# Patient Record
Sex: Female | Born: 1960 | Race: White | Hispanic: No | Marital: Married | Smoking: Never smoker
Health system: Southern US, Community
[De-identification: ages and names within clinical notes are randomized; demographics above are authoritative.]

## PROBLEM LIST (undated history)

## (undated) DIAGNOSIS — B1920 Unspecified viral hepatitis C without hepatic coma: Secondary | ICD-10-CM

## (undated) DIAGNOSIS — G43909 Migraine, unspecified, not intractable, without status migrainosus: Secondary | ICD-10-CM

## (undated) DIAGNOSIS — E785 Hyperlipidemia, unspecified: Secondary | ICD-10-CM

## (undated) DIAGNOSIS — F419 Anxiety disorder, unspecified: Secondary | ICD-10-CM

## (undated) DIAGNOSIS — K219 Gastro-esophageal reflux disease without esophagitis: Secondary | ICD-10-CM

## (undated) DIAGNOSIS — K838 Other specified diseases of biliary tract: Secondary | ICD-10-CM

## (undated) DIAGNOSIS — F32A Depression, unspecified: Secondary | ICD-10-CM

## (undated) DIAGNOSIS — R51 Headache: Secondary | ICD-10-CM

## (undated) DIAGNOSIS — G4482 Headache associated with sexual activity: Secondary | ICD-10-CM

## (undated) DIAGNOSIS — E079 Disorder of thyroid, unspecified: Secondary | ICD-10-CM

## (undated) DIAGNOSIS — K529 Noninfective gastroenteritis and colitis, unspecified: Secondary | ICD-10-CM

## (undated) DIAGNOSIS — E876 Hypokalemia: Secondary | ICD-10-CM

## (undated) DIAGNOSIS — F909 Attention-deficit hyperactivity disorder, unspecified type: Secondary | ICD-10-CM

## (undated) DIAGNOSIS — E039 Hypothyroidism, unspecified: Secondary | ICD-10-CM

## (undated) DIAGNOSIS — M1990 Unspecified osteoarthritis, unspecified site: Secondary | ICD-10-CM

## (undated) DIAGNOSIS — R519 Headache, unspecified: Secondary | ICD-10-CM

## (undated) DIAGNOSIS — F329 Major depressive disorder, single episode, unspecified: Secondary | ICD-10-CM

## (undated) HISTORY — PX: REDUCTION MAMMAPLASTY: SUR839

## (undated) HISTORY — PX: JOINT REPLACEMENT: SHX530

## (undated) HISTORY — PX: ABDOMINAL HYSTERECTOMY: SHX81

## (undated) HISTORY — PX: ROTATOR CUFF REPAIR: SHX139

## (undated) HISTORY — DX: Noninfective gastroenteritis and colitis, unspecified: K52.9

## (undated) HISTORY — DX: Other specified diseases of biliary tract: K83.8

## (undated) HISTORY — DX: Headache associated with sexual activity: G44.82

## (undated) HISTORY — DX: Migraine, unspecified, not intractable, without status migrainosus: G43.909

## (undated) HISTORY — DX: Hypokalemia: E87.6

---

## 2013-04-14 ENCOUNTER — Ambulatory Visit: Payer: Commercial Managed Care - PPO | Attending: Family Medicine | Admitting: Physical Therapy

## 2013-05-29 ENCOUNTER — Emergency Department (HOSPITAL_COMMUNITY): Payer: 59

## 2013-05-29 ENCOUNTER — Encounter (HOSPITAL_COMMUNITY): Payer: Self-pay | Admitting: *Deleted

## 2013-05-29 ENCOUNTER — Inpatient Hospital Stay (HOSPITAL_COMMUNITY)
Admission: EM | Admit: 2013-05-29 | Discharge: 2013-05-31 | DRG: 392 | Disposition: A | Discharge status: Home / Self Care | Payer: 59 | Attending: Internal Medicine | Admitting: Internal Medicine

## 2013-05-29 DIAGNOSIS — Z96659 Presence of unspecified artificial knee joint: Secondary | ICD-10-CM

## 2013-05-29 DIAGNOSIS — E876 Hypokalemia: Secondary | ICD-10-CM | POA: Diagnosis present

## 2013-05-29 DIAGNOSIS — F909 Attention-deficit hyperactivity disorder, unspecified type: Secondary | ICD-10-CM | POA: Diagnosis present

## 2013-05-29 DIAGNOSIS — F32A Depression, unspecified: Secondary | ICD-10-CM | POA: Diagnosis present

## 2013-05-29 DIAGNOSIS — G43909 Migraine, unspecified, not intractable, without status migrainosus: Secondary | ICD-10-CM

## 2013-05-29 DIAGNOSIS — R197 Diarrhea, unspecified: Secondary | ICD-10-CM

## 2013-05-29 DIAGNOSIS — K838 Other specified diseases of biliary tract: Secondary | ICD-10-CM

## 2013-05-29 DIAGNOSIS — R509 Fever, unspecified: Secondary | ICD-10-CM

## 2013-05-29 DIAGNOSIS — R109 Unspecified abdominal pain: Secondary | ICD-10-CM

## 2013-05-29 DIAGNOSIS — F329 Major depressive disorder, single episode, unspecified: Secondary | ICD-10-CM | POA: Diagnosis present

## 2013-05-29 DIAGNOSIS — A088 Other specified intestinal infections: Secondary | ICD-10-CM | POA: Diagnosis present

## 2013-05-29 DIAGNOSIS — K529 Noninfective gastroenteritis and colitis, unspecified: Secondary | ICD-10-CM

## 2013-05-29 DIAGNOSIS — R112 Nausea with vomiting, unspecified: Secondary | ICD-10-CM

## 2013-05-29 DIAGNOSIS — A0811 Acute gastroenteropathy due to Norwalk agent: Principal | ICD-10-CM | POA: Diagnosis present

## 2013-05-29 DIAGNOSIS — F341 Dysthymic disorder: Secondary | ICD-10-CM | POA: Diagnosis present

## 2013-05-29 DIAGNOSIS — E785 Hyperlipidemia, unspecified: Secondary | ICD-10-CM | POA: Diagnosis present

## 2013-05-29 DIAGNOSIS — E739 Lactose intolerance, unspecified: Secondary | ICD-10-CM | POA: Diagnosis present

## 2013-05-29 DIAGNOSIS — K219 Gastro-esophageal reflux disease without esophagitis: Secondary | ICD-10-CM | POA: Diagnosis present

## 2013-05-29 DIAGNOSIS — K828 Other specified diseases of gallbladder: Secondary | ICD-10-CM | POA: Diagnosis present

## 2013-05-29 DIAGNOSIS — E079 Disorder of thyroid, unspecified: Secondary | ICD-10-CM | POA: Diagnosis present

## 2013-05-29 DIAGNOSIS — K5289 Other specified noninfective gastroenteritis and colitis: Secondary | ICD-10-CM

## 2013-05-29 HISTORY — DX: Disorder of thyroid, unspecified: E07.9

## 2013-05-29 HISTORY — DX: Major depressive disorder, single episode, unspecified: F32.9

## 2013-05-29 HISTORY — DX: Depression, unspecified: F32.A

## 2013-05-29 HISTORY — DX: Other specified diseases of biliary tract: K83.8

## 2013-05-29 HISTORY — DX: Hypokalemia: E87.6

## 2013-05-29 HISTORY — DX: Hyperlipidemia, unspecified: E78.5

## 2013-05-29 HISTORY — DX: Gastro-esophageal reflux disease without esophagitis: K21.9

## 2013-05-29 HISTORY — DX: Migraine, unspecified, not intractable, without status migrainosus: G43.909

## 2013-05-29 HISTORY — DX: Noninfective gastroenteritis and colitis, unspecified: K52.9

## 2013-05-29 LAB — CBC WITH DIFFERENTIAL/PLATELET
Basophils Relative: 1 % (ref 0–1)
Hemoglobin: 13.5 g/dL (ref 12.0–15.0)
Lymphocytes Relative: 14 % (ref 12–46)
MCHC: 34.9 g/dL (ref 30.0–36.0)
Monocytes Relative: 11 % (ref 3–12)
Neutro Abs: 3.2 10*3/uL (ref 1.7–7.7)
Neutrophils Relative %: 75 % (ref 43–77)
RBC: 4.37 MIL/uL (ref 3.87–5.11)
WBC: 4.2 10*3/uL (ref 4.0–10.5)

## 2013-05-29 LAB — CBC
HCT: 39.2 % (ref 36.0–46.0)
MCH: 30.8 pg (ref 26.0–34.0)
MCHC: 34.4 g/dL (ref 30.0–36.0)
MCV: 89.5 fL (ref 78.0–100.0)
RDW: 13 % (ref 11.5–15.5)
WBC: 3.7 10*3/uL — ABNORMAL LOW (ref 4.0–10.5)

## 2013-05-29 LAB — OCCULT BLOOD, POC DEVICE: Fecal Occult Bld: NEGATIVE

## 2013-05-29 LAB — COMPREHENSIVE METABOLIC PANEL
AST: 19 U/L (ref 0–37)
Albumin: 3 g/dL — ABNORMAL LOW (ref 3.5–5.2)
Alkaline Phosphatase: 91 U/L (ref 39–117)
BUN: 5 mg/dL — ABNORMAL LOW (ref 6–23)
Chloride: 94 mEq/L — ABNORMAL LOW (ref 96–112)
Potassium: 3.1 mEq/L — ABNORMAL LOW (ref 3.5–5.1)
Total Bilirubin: 0.3 mg/dL (ref 0.3–1.2)

## 2013-05-29 LAB — CREATININE, SERUM: Creatinine, Ser: 0.7 mg/dL (ref 0.50–1.10)

## 2013-05-29 LAB — URINALYSIS, ROUTINE W REFLEX MICROSCOPIC
Bilirubin Urine: NEGATIVE
Ketones, ur: NEGATIVE mg/dL
Protein, ur: NEGATIVE mg/dL
Urobilinogen, UA: 0.2 mg/dL (ref 0.0–1.0)

## 2013-05-29 LAB — LACTIC ACID, PLASMA: Lactic Acid, Venous: 0.8 mmol/L (ref 0.5–2.2)

## 2013-05-29 LAB — LIPASE, BLOOD: Lipase: 22 U/L (ref 11–59)

## 2013-05-29 LAB — URINE MICROSCOPIC-ADD ON

## 2013-05-29 MED ORDER — CIPROFLOXACIN IN D5W 400 MG/200ML IV SOLN
400.0000 mg | Freq: Two times a day (BID) | INTRAVENOUS | Status: DC
  Administered 2013-05-29 – 2013-05-31 (×4): 400 mg via INTRAVENOUS
  Filled 2013-05-29 (×5): qty 200

## 2013-05-29 MED ORDER — IOHEXOL 300 MG/ML  SOLN: 50.0000 mL | Freq: Once | INTRAMUSCULAR | Status: DC | PRN

## 2013-05-29 MED ORDER — METRONIDAZOLE IN NACL 5-0.79 MG/ML-% IV SOLN
500.0000 mg | Freq: Three times a day (TID) | INTRAVENOUS | Status: DC
  Administered 2013-05-29 – 2013-05-31 (×6): 500 mg via INTRAVENOUS
  Filled 2013-05-29 (×7): qty 100

## 2013-05-29 MED ORDER — SODIUM CHLORIDE 0.9 % IV SOLN
INTRAVENOUS | Status: DC
  Administered 2013-05-29 – 2013-05-31 (×5): via INTRAVENOUS
  Filled 2013-05-29 (×9): qty 1000

## 2013-05-29 MED ORDER — POTASSIUM CHLORIDE CRYS ER 20 MEQ PO TBCR
40.0000 meq | EXTENDED_RELEASE_TABLET | Freq: Once | ORAL | Status: AC
  Administered 2013-05-29: 40 meq via ORAL
  Filled 2013-05-29 (×2): qty 1

## 2013-05-29 MED ORDER — ALUM & MAG HYDROXIDE-SIMETH 200-200-20 MG/5ML PO SUSP: 30.0000 mL | Freq: Four times a day (QID) | ORAL | Status: DC | PRN

## 2013-05-29 MED ORDER — ONDANSETRON HCL 4 MG PO TABS: 4.0000 mg | ORAL_TABLET | Freq: Four times a day (QID) | ORAL | Status: DC | PRN

## 2013-05-29 MED ORDER — AMPHETAMINE-DEXTROAMPHET ER 25 MG PO CP24: 25.0000 mg | ORAL_CAPSULE | ORAL | Status: DC

## 2013-05-29 MED ORDER — SODIUM CHLORIDE 0.9 % IV BOLUS (SEPSIS)
500.0000 mL | Freq: Once | INTRAVENOUS | Status: AC
  Administered 2013-05-29: 500 mL via INTRAVENOUS

## 2013-05-29 MED ORDER — PANTOPRAZOLE SODIUM 40 MG PO TBEC
40.0000 mg | DELAYED_RELEASE_TABLET | Freq: Every day | ORAL | Status: DC
  Administered 2013-05-29 – 2013-05-31 (×3): 40 mg via ORAL
  Filled 2013-05-29 (×3): qty 1

## 2013-05-29 MED ORDER — ONDANSETRON HCL 4 MG/2ML IJ SOLN
4.0000 mg | INTRAMUSCULAR | Status: AC | PRN
  Administered 2013-05-29 (×2): 4 mg via INTRAVENOUS
  Filled 2013-05-29 (×2): qty 2

## 2013-05-29 MED ORDER — MECLIZINE HCL 25 MG PO TABS
25.0000 mg | ORAL_TABLET | Freq: Three times a day (TID) | ORAL | Status: DC | PRN
  Administered 2013-05-29: 25 mg via ORAL
  Filled 2013-05-29: qty 1

## 2013-05-29 MED ORDER — DICYCLOMINE HCL 10 MG/ML IM SOLN
20.0000 mg | Freq: Once | INTRAMUSCULAR | Status: AC
  Administered 2013-05-29: 20 mg via INTRAMUSCULAR
  Filled 2013-05-29: qty 2

## 2013-05-29 MED ORDER — ALBUTEROL SULFATE (5 MG/ML) 0.5% IN NEBU: 2.5000 mg | INHALATION_SOLUTION | RESPIRATORY_TRACT | Status: DC | PRN

## 2013-05-29 MED ORDER — SODIUM CHLORIDE 0.9 % IV SOLN
INTRAVENOUS | Status: DC
  Administered 2013-05-29 (×2): via INTRAVENOUS

## 2013-05-29 MED ORDER — ONDANSETRON HCL 4 MG/2ML IJ SOLN
4.0000 mg | Freq: Three times a day (TID) | INTRAMUSCULAR | Status: AC | PRN
  Filled 2013-05-29: qty 2

## 2013-05-29 MED ORDER — ENOXAPARIN SODIUM 40 MG/0.4ML ~~LOC~~ SOLN
40.0000 mg | SUBCUTANEOUS | Status: DC
  Administered 2013-05-29 – 2013-05-30 (×2): 40 mg via SUBCUTANEOUS
  Filled 2013-05-29 (×3): qty 0.4

## 2013-05-29 MED ORDER — LEVOTHYROXINE SODIUM 100 MCG PO TABS
100.0000 ug | ORAL_TABLET | Freq: Every day | ORAL | Status: DC
  Administered 2013-05-30 – 2013-05-31 (×2): 100 ug via ORAL
  Filled 2013-05-29 (×3): qty 1

## 2013-05-29 MED ORDER — ACETAMINOPHEN 500 MG PO TABS
1000.0000 mg | ORAL_TABLET | Freq: Once | ORAL | Status: AC
  Administered 2013-05-29: 1000 mg via ORAL
  Filled 2013-05-29: qty 2

## 2013-05-29 MED ORDER — PAROXETINE HCL 30 MG PO TABS
60.0000 mg | ORAL_TABLET | ORAL | Status: DC
  Administered 2013-05-30 – 2013-05-31 (×2): 60 mg via ORAL
  Filled 2013-05-29 (×3): qty 2

## 2013-05-29 MED ORDER — ZOLPIDEM TARTRATE 5 MG PO TABS: 5.0000 mg | ORAL_TABLET | Freq: Every evening | ORAL | Status: DC | PRN

## 2013-05-29 MED ORDER — ACETAMINOPHEN 325 MG PO TABS
650.0000 mg | ORAL_TABLET | Freq: Four times a day (QID) | ORAL | Status: DC | PRN
  Administered 2013-05-30: 650 mg via ORAL
  Filled 2013-05-29: qty 2

## 2013-05-29 MED ORDER — SUMATRIPTAN SUCCINATE 100 MG PO TABS
100.0000 mg | ORAL_TABLET | ORAL | Status: DC | PRN
  Filled 2013-05-29: qty 1

## 2013-05-29 MED ORDER — MORPHINE SULFATE 4 MG/ML IJ SOLN
4.0000 mg | INTRAMUSCULAR | Status: DC | PRN
  Administered 2013-05-29: 4 mg via INTRAVENOUS
  Filled 2013-05-29: qty 1

## 2013-05-29 MED ORDER — ACETAMINOPHEN 650 MG RE SUPP: 650.0000 mg | Freq: Four times a day (QID) | RECTAL | Status: DC | PRN

## 2013-05-29 MED ORDER — CLONAZEPAM 1 MG PO TABS
1.0000 mg | ORAL_TABLET | Freq: Every day | ORAL | Status: DC | PRN
  Administered 2013-05-29 – 2013-05-30 (×2): 1 mg via ORAL
  Filled 2013-05-29 (×2): qty 1

## 2013-05-29 MED ORDER — SACCHAROMYCES BOULARDII 250 MG PO CAPS
250.0000 mg | ORAL_CAPSULE | Freq: Two times a day (BID) | ORAL | Status: DC
  Administered 2013-05-29 – 2013-05-31 (×5): 250 mg via ORAL
  Filled 2013-05-29 (×6): qty 1

## 2013-05-29 MED ORDER — SODIUM CHLORIDE 0.9 % IV SOLN: INTRAVENOUS | Status: DC

## 2013-05-29 MED ORDER — PROPRANOLOL HCL 20 MG PO TABS
20.0000 mg | ORAL_TABLET | Freq: Two times a day (BID) | ORAL | Status: DC
  Administered 2013-05-29 – 2013-05-31 (×4): 20 mg via ORAL
  Filled 2013-05-29 (×6): qty 1

## 2013-05-29 MED ORDER — ONDANSETRON HCL 4 MG/2ML IJ SOLN
4.0000 mg | Freq: Four times a day (QID) | INTRAMUSCULAR | Status: DC | PRN
  Administered 2013-05-29: 4 mg via INTRAVENOUS

## 2013-05-29 MED ORDER — IOHEXOL 300 MG/ML  SOLN
100.0000 mL | Freq: Once | INTRAMUSCULAR | Status: AC | PRN
  Administered 2013-05-29: 100 mL via INTRAVENOUS

## 2013-05-29 NOTE — ED Notes (Signed)
Pt reports diarrhea that started on wed, then had fever and n/v thurs and Friday. Reports no relief with immodium.

## 2013-05-29 NOTE — ED Notes (Signed)
Made pt aware that she is waiting to go to Ultrasound. Will continue to monitor.

## 2013-05-29 NOTE — ED Notes (Signed)
Pt done with contrast. CT made aware.

## 2013-05-29 NOTE — ED Notes (Signed)
Pt stated that she has been having diarrhea since Wednesday. She has n/v on Thursday and Friday. She has been having intermittent fevers since Wednesday. No respiratory or cardiac issues. She is alert and oriented. Will continue to monitor.

## 2013-05-29 NOTE — H&P (Signed)
PATIENT DETAILS Name: Kathryn Castaneda Age: 52 y.o. Sex: female Date of Birth: 05-09-1961 Admit Date: 05/29/2013 PCP:No primary provider on file.   CHIEF COMPLAINT:  Fever, Nausea, vomiting and diarrhea for the past 4 days  HPI: Kathryn Castaneda is a 52 y.o. female with a Past Medical History of depression, migraine headaches, lactose intolerance ADHD who presents today with the above noted complaint. Patient works as a Radiation protection practitioner for [employer], she is apparently in her usual state of health approximately 4 days ago when she started having vomiting and loose watery stools. She claims that her stools are watery and she goes maybe 10-15 times a day. There is no blood or mucus in the stools. She's had numerous episodes of vomiting the past 4 days, however the vomiting seems to have slowly resolved, her last vomitus was yesterday morning. She claims  she's had fever as high as 102F at home. She presented to the emergency room for evaluation, where she was found to be febrile, she was not found to have any leukocytosis. She then underwent a CT scan of the abdomen which showed dilatation of her common bile duct, without any evidence of obstruction on the CT scan. She also had normal liver function tests and normal lipase. I was subsequently asked to admit this patient for further evaluation and treatment. Patient has had no recent sick contacts, she has had no recent travel. None of her family members or friends have a similar illness. She claims she does get abdominal cramping/pain with bowel movements, however otherwise she does not have any abdominal pain. There is also no history of shortness of breath or chest pain.   ALLERGIES:  No Known Allergies  PAST MEDICAL HISTORY: Past Medical History  Diagnosis Date  . Hyperlipemia   . Thyroid disease   . Depression   . Acid reflux     PAST SURGICAL HISTORY: Hysterectomy Cesarean section Knee replacement  MEDICATIONS AT HOME: Prior to Admission  medications   Medication Sig Start Date End Date Taking? Authorizing Provider  amphetamine-dextroamphetamine (ADDERALL XR) 25 MG 24 hr capsule Take 25 mg by mouth every morning.   Yes Historical Provider, MD  clonazePAM (KLONOPIN) 1 MG tablet Take 1 mg by mouth daily as needed for anxiety.   Yes Historical Provider, MD  levothyroxine (SYNTHROID, LEVOTHROID) 100 MCG tablet Take 100 mcg by mouth daily before breakfast.   Yes Historical Provider, MD  omeprazole (PRILOSEC) 20 MG capsule Take 20 mg by mouth daily.   Yes Historical Provider, MD  PARoxetine (PAXIL) 30 MG tablet Take 60 mg by mouth every morning.    Yes Historical Provider, MD  propranolol (INDERAL) 20 MG tablet Take 20 mg by mouth 2 (two) times daily.    Yes Historical Provider, MD  SUMAtriptan (IMITREX) 100 MG tablet Take 100 mg by mouth every 2 (two) hours as needed for migraine.   Yes Historical Provider, MD    FAMILY HISTORY: No family history of coronary artery disease  SOCIAL HISTORY:  reports that she has never smoked. She does not have any smokeless tobacco history on file. She reports that  drinks alcohol. She reports that she does not use illicit drugs.  REVIEW OF SYSTEMS:  Constitutional:   No  weight loss, night sweats,  Fevers, chills, fatigue.  HEENT:    No headaches, Difficulty swallowing,Tooth/dental problems,Sore throat,  No sneezing, itching, ear ache, nasal congestion, post nasal drip,   Cardio-vascular: No chest pain,  Orthopnea, PND, swelling in lower extremities, anasarca,  dizziness, palpitations  GI:  No heartburn, indigestion,  loss of appetite  Resp: No shortness of breath with exertion or at rest.  No excess mucus, no productive cough, No non-productive cough,  No coughing up of blood.No change in color of mucus.No wheezing.No chest wall deformity  Skin:  no rash or lesions.  GU:  no dysuria, change in color of urine, no urgency or frequency.  No flank pain.  Musculoskeletal: No joint  pain or swelling.  No decreased range of motion.  No back pain.  Psych: No change in mood or affect. No depression or anxiety.  No memory loss.   PHYSICAL EXAM: Blood pressure 123/85, pulse 87, temperature 101.3 F (38.5 C), resp. rate 16, SpO2 100.00%.  General appearance :Awake, alert, not in any distress. Speech Clear. Not toxic Looking HEENT: Atraumatic and Normocephalic, pupils equally reactive to light and accomodation Neck: supple, no JVD. No cervical lymphadenopathy.  Chest:Good air entry bilaterally, no added sounds  CVS: S1 S2 regular, no murmurs.  Abdomen: Bowel sounds present, Non tender and not distended with no gaurding, rigidity or rebound. Extremities: B/L Lower Ext shows no edema, both legs are warm to touch Neurology: Awake alert, and oriented X 3, CN II-XII intact, Non focal Skin:No Rash Wounds:N/A  LABS ON ADMISSION:   Recent Labs  05/29/13 0750  NA 131*  K 3.1*  CL 94*  CO2 25  GLUCOSE 94  BUN 5*  CREATININE 0.73  CALCIUM 8.8    Recent Labs  05/29/13 0750  AST 19  ALT 7  ALKPHOS 91  BILITOT 0.3  PROT 6.5  ALBUMIN 3.0*    Recent Labs  05/29/13 0750  LIPASE 22    Recent Labs  05/29/13 0750  WBC 4.2  NEUTROABS 3.2  HGB 13.5  HCT 38.7  MCV 88.6  PLT 157   No results found for this basename: CKTOTAL, CKMB, CKMBINDEX, TROPONINI,  in the last 72 hours No results found for this basename: DDIMER,  in the last 72 hours No components found with this basename: POCBNP,    RADIOLOGIC STUDIES ON ADMISSION: Dg Chest 2 View  05/29/2013   *RADIOLOGY REPORT*  Clinical Data:  Nausea, vomiting and fever.  CHEST - 2 VIEW  Comparison: None  Findings: The heart size and mediastinal contours are within normal limits.  Both lungs are clear.  The visualized skeletal structures are unremarkable.  IMPRESSION: No active disease.   Original Report Authenticated By: Irish Lack, M.D.   US Abdomen Complete  05/29/2013    *RADIOLOGY REPORT*  Clinical  Data:  Abnormal CT scan.  Nausea and vomiting.  Diarrhea  ABDOMINAL ULTRASOUND COMPLETE  Comparison:  05/29/2013  Findings:  Gallbladder:  No gallbladder wall thickening.  No stones or sludge identified.  Negative sonographic Murphy's sign.  Common Bile Duct:  The common bile duct is increased in caliber measuring up to 14 mm.  Liver: Mild intrahepatic biliary dilatation is identified  IVC:  Appears normal.  Pancreas:  No abnormality identified. Pancreatic duct is upper limits of normal in size measuring 3 mm.  Spleen:  Within normal limits in size and echotexture.  Right kidney:  Normal in size and parenchymal echogenicity.  No evidence of mass or hydronephrosis.  Left kidney:  Normal in size and parenchymal echogenicity.  No evidence of mass or hydronephrosis.  Abdominal Aorta:  No aneurysm identified.  IMPRESSION:  1.  No gallstones or evidence of cholecystitis. 2.  Common bile duct dilatation.  The distal common bile  duct is not visualize due to overlying bowel gas.  Consider further evaluation with MRCP.   Original Report Authenticated By: Signa Kell, M.D.   Ct Abdomen Pelvis W Contrast  05/29/2013   *RADIOLOGY REPORT*  Clinical Data: Nausea, vomiting, diarrhea and fever.  CT ABDOMEN AND PELVIS WITH CONTRAST  Technique:  Multidetector CT imaging of the abdomen and pelvis was performed following the standard protocol during bolus administration of intravenous contrast.  Contrast: OMNIPAQUE IOHEXOL 300 MG/ML  SOLN  Comparison: None.  Findings: The common bile duct is dilated, measuring up to 13 mm in diameter.  The duct tapers at the level of the pancreatic head and obvious mass or ductal calculus is not identified.  Recommend correlation with liver function tests.  There is some associated distention of the gallbladder without visible gallbladder inflammation.  Minimally prominent central intrahepatic ducts are noted without evidence of true intrahepatic biliary ductal dilatation.  No focal liver  lesions are identified.  The pancreas, spleen, adrenal glands and kidneys are within normal limits.  There is fluid throughout much of the colon which is not overtly thickened or dilated.  Findings may be consistent with some degree of enteritis.  The small bowel is unremarkable.  No focal inflammatory process, free fluid or abscess is identified.  There is no evidence of mass or enlarged lymph nodes.  The uterus has been removed.  The bladder is unremarkable.  There is a tiny ventral hernia present in the midline upper abdomen containing fat.  Bony structures show significant degenerative disc disease at L4-5 with associated mild anterolisthesis of L4-L5.  IMPRESSION:  1.  Unexplained dilatation of the common bile duct up to 13 mm and suggestion of some distention of the gallbladder.  An obstructing process is not definitely identified by CT.  Recommend correlation with liver function tests. 2.  Fluid throughout the colon which is not overtly thickened or dilated.  This may be consistent with enteritis. 3.  Tiny midline ventral hernia in the upper abdomen containing fat.   Original Report Authenticated By: Irish Lack, M.D.    ASSESSMENT AND PLAN: Present on Admission:  . Enteritis - I suspect the nausea vomiting and diarrhea is results of this. She is febrile, although with no leukocytosis I will empirically start her on ciprofloxacin and Flagyl. If she recovers very quickly, we can consider stopping antibiotics in the next one or 2 days.  - Stool C. difficile PCR is already negative, will send out a stool GI pathogen panel, provide supportive care and place on probiotics.  . Dilation of biliary tract - She has normal LFTs, normal lipase, no obstruction or gallstones seen either in CT scan or an abdominal ultrasound, I am not sure if any of her presenting symptoms are related to this finding. This very well could be an incidental finding. I will hold off on ordering a MRCP for now, I have discussed  the case with Dr. Bosie Clos will evaluate  and provide further recommendations   . Hypokalemia - Secondary to GI loss  - Repeat and recheck in a.m.   Marland Kitchen Depression with anxiety  - Continue Paxil, continue Klonopin as needed for anxiety   . Migraine - Currently without headache  - He was sumatriptan and as needed   Further plan will depend as patient's clinical course evolves and further radiologic and laboratory data become available. Patient will be monitored closely.  DVT Prophylaxis: Prophylactic Lovenox   Code Status: Full Code  Total time spent for  admission equals 45 minutes.  [facility] Pager [phone]  If 7PM-7AM, please contact night-coverage [email or web address] Password [facility] 05/29/2013, 3:15 PM

## 2013-05-29 NOTE — Consult Note (Addendum)
Referring Provider: Dr. Jerral Ralph Primary Care Physician:  No primary provider on file. Primary Gastroenterologist:  Gentry Fitz  Reason for Consultation:  Abnormal CT scan; N/V/D  HPI: Kathryn Castaneda is a 52 y.o. female with acute onset of N/V/watery diarrhea 4 days ago. Has been having a loose nonbloody stool more than 10 times per day. Denies any blood in the stool. She also has been having profuse nonbloody vomitus. No vomiting since yesterday. Reports fevers at home of 102. Denies abdominal pain or sick contacts. C. Diff negative. GI pathogen panel pending. 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 CT done in ER that showed dilation of the CBD to 1.3 cm and distention of the GB without any stones or sludge. Reports occasional episodes in the past of diarrhea and states that she thinks she has IBS. Felt pain in abdomen when U/S probe was pressed on her RUQ but denies any pain there prior to that point. Reports normal colonoscopy 3 years ago.      Past Medical History  Diagnosis Date  . Hyperlipemia   . Thyroid disease   . Depression   . Acid reflux     History reviewed. No pertinent past surgical history.  Prior to Admission medications   Medication Sig Start Date End Date Taking? Authorizing Provider  amphetamine-dextroamphetamine (ADDERALL XR) 25 MG 24 hr capsule Take 25 mg by mouth every morning.   Yes Historical Provider, MD  clonazePAM (KLONOPIN) 1 MG tablet Take 1 mg by mouth daily as needed for anxiety.   Yes Historical Provider, MD  levothyroxine (SYNTHROID, LEVOTHROID) 100 MCG tablet Take 100 mcg by mouth daily before breakfast.   Yes Historical Provider, MD  omeprazole (PRILOSEC) 20 MG capsule Take 20 mg by mouth daily.   Yes Historical Provider, MD  PARoxetine (PAXIL) 30 MG tablet Take 60 mg by mouth every morning.    Yes Historical Provider, MD  propranolol (INDERAL) 20 MG tablet Take 20 mg by mouth 2 (two) times daily.    Yes Historical  Provider, MD  SUMAtriptan (IMITREX) 100 MG tablet Take 100 mg by mouth every 2 (two) hours as needed for migraine.   Yes Historical Provider, MD    Scheduled Meds: . ciprofloxacin  400 mg Intravenous Q12H  . enoxaparin (LOVENOX) injection  40 mg Subcutaneous Q24H  . [START ON 05/30/2013] levothyroxine  100 mcg Oral QAC breakfast  . metronidazole  500 mg Intravenous Q8H  . pantoprazole  40 mg Oral Q breakfast  . [START ON 05/30/2013] PARoxetine  60 mg Oral BH-q7a  . propranolol  20 mg Oral BID  . saccharomyces boulardii  250 mg Oral BID   Continuous Infusions: . sodium chloride 150 mL/hr at 05/29/13 1530  . sodium chloride 0.9 % 1,000 mL with potassium chloride 20 mEq infusion 150 mL/hr at 05/29/13 1643   PRN Meds:.acetaminophen, acetaminophen, albuterol, alum & mag hydroxide-simeth, clonazePAM, meclizine, ondansetron (ZOFRAN) IV, ondansetron (ZOFRAN) IV, ondansetron, SUMAtriptan, zolpidem  Allergies as of 05/29/2013  . (No Known Allergies)    History reviewed. No pertinent family history.  History   Social History  . Marital Status: Married    Spouse Name: N/A    Number of Children: N/A  . Years of Education: N/A   Occupational History  . Not on file.   Social History Main Topics  . Smoking status: Never Smoker   . Smokeless tobacco: Not on file  . Alcohol Use: Yes  Comment: occ  . Drug Use: No  . Sexually Active: Not on file   Other Topics Concern  . Not on file   Social History Narrative  . No narrative on file    Review of Systems: All negative except as stated above in HPI.  Physical Exam: Vital signs: Filed Vitals:   05/29/13 1531  BP: 157/87  Pulse: 102  Temp: 98 F (36.7 C)  Resp: 22   Last BM Date: 05/29/13 General:   Alert,  Well-developed, well-nourished, pleasant and cooperative in NAD HEENT: oropharynx clear, anicteric Neck: supple, nontender Lungs:  Clear throughout to auscultation.   No wheezes, crackles, or rhonchi. No acute  distress. Heart:  Regular rate and rhythm; no murmurs, clicks, rubs,  or gallops. Abdomen: soft, diffusely tender with voluntary guarding, nondistended, +BS Rectal:  Deferred Ext: no edema  GI:  Lab Results:  Recent Labs  05/29/13 0750 05/29/13 1654  WBC 4.2 3.7*  HGB 13.5 13.5  HCT 38.7 39.2  PLT 157 139*   BMET  Recent Labs  05/29/13 0750 05/29/13 1654  NA 131*  --   K 3.1*  --   CL 94*  --   CO2 25  --   GLUCOSE 94  --   BUN 5*  --   CREATININE 0.73 0.70  CALCIUM 8.8  --    LFT  Recent Labs  05/29/13 0750  PROT 6.5  ALBUMIN 3.0*  AST 19  ALT 7  ALKPHOS 91  BILITOT 0.3   PT/INR No results found for this basename: LABPROT, INR,  in the last 72 hours   Studies/Results: Dg Chest 2 View  05/29/2013   *RADIOLOGY REPORT*  Clinical Data:  Nausea, vomiting and fever.  CHEST - 2 VIEW  Comparison: None  Findings: The heart size and mediastinal contours are within normal limits.  Both lungs are clear.  The visualized skeletal structures are unremarkable.  IMPRESSION: No active disease.   Original Report Authenticated By: Irish Lack, M.D.   US Abdomen Complete  05/29/2013    *RADIOLOGY REPORT*  Clinical Data:  Abnormal CT scan.  Nausea and vomiting.  Diarrhea  ABDOMINAL ULTRASOUND COMPLETE  Comparison:  05/29/2013  Findings:  Gallbladder:  No gallbladder wall thickening.  No stones or sludge identified.  Negative sonographic Murphy's sign.  Common Bile Duct:  The common bile duct is increased in caliber measuring up to 14 mm.  Liver: Mild intrahepatic biliary dilatation is identified  IVC:  Appears normal.  Pancreas:  No abnormality identified. Pancreatic duct is upper limits of normal in size measuring 3 mm.  Spleen:  Within normal limits in size and echotexture.  Right kidney:  Normal in size and parenchymal echogenicity.  No evidence of mass or hydronephrosis.  Left kidney:  Normal in size and parenchymal echogenicity.  No evidence of mass or hydronephrosis.   Abdominal Aorta:  No aneurysm identified.  IMPRESSION:  1.  No gallstones or evidence of cholecystitis. 2.  Common bile duct dilatation.  The distal common bile duct is not visualize due to overlying bowel gas.  Consider further evaluation with MRCP.   Original Report Authenticated By: Signa Kell, M.D.   Ct Abdomen Pelvis W Contrast  05/29/2013   *RADIOLOGY REPORT*  Clinical Data: Nausea, vomiting, diarrhea and fever.  CT ABDOMEN AND PELVIS WITH CONTRAST  Technique:  Multidetector CT imaging of the abdomen and pelvis was performed following the standard protocol during bolus administration of intravenous contrast.  Contrast: OMNIPAQUE IOHEXOL 300 MG/ML  SOLN  Comparison: None.  Findings: The common bile duct is dilated, measuring up to 13 mm in diameter.  The duct tapers at the level of the pancreatic head and obvious mass or ductal calculus is not identified.  Recommend correlation with liver function tests.  There is some associated distention of the gallbladder without visible gallbladder inflammation.  Minimally prominent central intrahepatic ducts are noted without evidence of true intrahepatic biliary ductal dilatation.  No focal liver lesions are identified.  The pancreas, spleen, adrenal glands and kidneys are within normal limits.  There is fluid throughout much of the colon which is not overtly thickened or dilated.  Findings may be consistent with some degree of enteritis.  The small bowel is unremarkable.  No focal inflammatory process, free fluid or abscess is identified.  There is no evidence of mass or enlarged lymph nodes.  The uterus has been removed.  The bladder is unremarkable.  There is a tiny ventral hernia present in the midline upper abdomen containing fat.  Bony structures show significant degenerative disc disease at L4-5 with associated mild anterolisthesis of L4-L5.  IMPRESSION:  1.  Unexplained dilatation of the common bile duct up to 13 mm and suggestion of some distention  of the gallbladder.  An obstructing process is not definitely identified by CT.  Recommend correlation with liver function tests. 2.  Fluid throughout the colon which is not overtly thickened or dilated.  This may be consistent with enteritis. 3.  Tiny midline ventral hernia in the upper abdomen containing fat.   Original Report Authenticated By: Irish Lack, M.D.    Impression/Plan: 52 yo with acute onset of N/V/D likely due to viral gastroenteritis who had a CT scan done showing dilated CBD without an obvious source including no gallstones. I think her CT findings are unrelated to her N/V/D, however a CBD of 13 mm should be evaluated further even though she is asymptomatic. Would do MRCP to look for early sclerosing cholangitis and other biliary changes. If MRCP negative for a source, then no additional workup of CT findings needed as long as LFTs remain normal in future. If MRCP has positive findings, then may need an EUS in the future. Will hold off on autoimmune serologies with normal LFTs unless MRCP has positive findings.   Avoid antimotility agents for her diarrhea until infection ruled out. C. Diff negative. GI pathogen panel pending. Norovirus would be high on my differential for causing her N/V/D. If her diarrhea persists and no source identified on stool studies then may need to do a colonoscopy. Continue supportive care and await stool studies. She likely has a component of IBS as well.    LOS: 0 days   Mandela Bello C.  05/29/2013, 7:53 PM

## 2013-05-29 NOTE — ED Provider Notes (Signed)
History     CSN: [id number]  Arrival date & time 05/29/13  4098   First MD Initiated Contact with Patient 05/29/13 [phone]      Chief Complaint  Patient presents with  . Diarrhea  . Emesis     HPI Pt was seen at 0750.  Per pt, c/o gradual onset and persistence of multiple intermittent episodes of N/V/D that began 3 days ago.   Describes the stools as "watery." Has been associated with home fevers to "101," as well as generalized abd "cramping" pain. Has been taking imodium without relief. Has not taken any meds for fever. Denies CP/SOB, no back pain, no fevers, no black or blood in stools or emesis.     Past Medical History  Diagnosis Date  . Hyperlipemia   . Thyroid disease   . Depression   . Acid reflux     History reviewed. No pertinent past surgical history.   History  Substance Use Topics  . Smoking status: Never Smoker   . Smokeless tobacco: Not on file  . Alcohol Use: Yes     Comment: occ      Review of Systems ROS: Statement: All systems negative except as marked or noted in the HPI; Constitutional: Negative for fever and chills. ; ; Eyes: Negative for eye pain, redness and discharge. ; ; ENMT: Negative for ear pain, hoarseness, nasal congestion, sinus pressure and sore throat. ; ; Cardiovascular: Negative for chest pain, palpitations, diaphoresis, dyspnea and peripheral edema. ; ; Respiratory: Negative for cough, wheezing and stridor. ; ; Gastrointestinal: +N/V/D, abd pain. Negative for blood in stool, hematemesis, jaundice and rectal bleeding. . ; ; Genitourinary: Negative for dysuria, flank pain and hematuria. ; ; Musculoskeletal: Negative for back pain and neck pain. Negative for swelling and trauma.; ; Skin: Negative for pruritus, rash, abrasions, blisters, bruising and skin lesion.; ; Neuro: Negative for headache, lightheadedness and neck stiffness. Negative for weakness, altered level of consciousness , altered mental status, extremity weakness, paresthesias,  involuntary movement, seizure and syncope.       Allergies  Review of patient's allergies indicates no known allergies.  Home Medications   Current Outpatient Rx  Name  Route  Sig  Dispense  Refill  . amphetamine-dextroamphetamine (ADDERALL XR) 25 MG 24 hr capsule   Oral   Take 25 mg by mouth every morning.         . clonazePAM (KLONOPIN) 1 MG tablet   Oral   Take 1 mg by mouth daily as needed for anxiety.         Marland Kitchen levothyroxine (SYNTHROID, LEVOTHROID) 100 MCG tablet   Oral   Take 100 mcg by mouth daily before breakfast.         . omeprazole (PRILOSEC) 20 MG capsule   Oral   Take 20 mg by mouth daily.         Marland Kitchen PARoxetine (PAXIL) 30 MG tablet   Oral   Take 60 mg by mouth every morning.          . propranolol (INDERAL) 20 MG tablet   Oral   Take 20 mg by mouth 2 (two) times daily.          . SUMAtriptan (IMITREX) 100 MG tablet   Oral   Take 100 mg by mouth every 2 (two) hours as needed for migraine.           BP 145/93  Pulse 108  Temp(Src) 101.3 F (38.5 C)  Resp 18  SpO2  99%  Physical Exam 0755: Physical examination:  Nursing notes reviewed; Vital signs and O2 SAT reviewed;  Constitutional: Well developed, Well nourished, In no acute distress; Head:  Normocephalic, atraumatic; Eyes: EOMI, PERRL, No scleral icterus; ENMT: Mouth and pharynx normal, Mucous membranes dry; Neck: Supple, Full range of motion, No lymphadenopathy; Cardiovascular: Regular rate and rhythm, No murmur, rub, or gallop; Respiratory: Breath sounds clear & equal bilaterally, No rales, rhonchi, wheezes.  Speaking full sentences with ease, Normal respiratory effort/excursion; Chest: Nontender, Movement normal; Abdomen: Soft, +diffuse tenderness to palp. No rebound or guarding. Nondistended, Normal bowel sounds. Rectal exam performed w/permission of pt and ED RN chaperone present.  Anal tone normal.  Non-tender, soft yellow stool in rectal vault, heme neg.  No fissures, no external  hemorrhoids, no palp masses.;; Genitourinary: No CVA tenderness; Extremities: Pulses normal, No tenderness, No edema, No calf edema or asymmetry.; Neuro: AA&Ox3, Major CN grossly intact.  Speech clear. No gross focal motor or sensory deficits in extremities.; Skin: Color normal, Warm, Dry.   ED Course  Procedures    MDM  MDM Reviewed: previous chart, nursing note and vitals Reviewed previous: labs Interpretation: labs, x-ray, CT scan and ultrasound     Results for orders placed during the hospital encounter of 05/29/13  CLOSTRIDIUM DIFFICILE BY PCR      Result Value Range   C difficile by pcr NEGATIVE  NEGATIVE  URINALYSIS, ROUTINE W REFLEX MICROSCOPIC      Result Value Range   Color, Urine YELLOW  YELLOW   APPearance CLEAR  CLEAR   Specific Gravity, Urine 1.007  1.005 - 1.030   pH 7.0  5.0 - 8.0   Glucose, UA NEGATIVE  NEGATIVE mg/dL   Hgb urine dipstick TRACE (*) NEGATIVE   Bilirubin Urine NEGATIVE  NEGATIVE   Ketones, ur NEGATIVE  NEGATIVE mg/dL   Protein, ur NEGATIVE  NEGATIVE mg/dL   Urobilinogen, UA 0.2  0.0 - 1.0 mg/dL   Nitrite NEGATIVE  NEGATIVE   Leukocytes, UA NEGATIVE  NEGATIVE  CBC WITH DIFFERENTIAL      Result Value Range   WBC 4.2  4.0 - 10.5 K/uL   RBC 4.37  3.87 - 5.11 MIL/uL   Hemoglobin 13.5  12.0 - 15.0 g/dL   HCT 08.6  57.8 - 46.9 %   MCV 88.6  78.0 - 100.0 fL   MCH 30.9  26.0 - 34.0 pg   MCHC 34.9  30.0 - 36.0 g/dL   RDW 62.9  52.8 - 41.3 %   Platelets 157  150 - 400 K/uL   Neutrophils Relative % 75  43 - 77 %   Neutro Abs 3.2  1.7 - 7.7 K/uL   Lymphocytes Relative 14  12 - 46 %   Lymphs Abs 0.6 (*) 0.7 - 4.0 K/uL   Monocytes Relative 11  3 - 12 %   Monocytes Absolute 0.4  0.1 - 1.0 K/uL   Eosinophils Relative 0  0 - 5 %   Eosinophils Absolute 0.0  0.0 - 0.7 K/uL   Basophils Relative 1  0 - 1 %   Basophils Absolute 0.0  0.0 - 0.1 K/uL  COMPREHENSIVE METABOLIC PANEL      Result Value Range   Sodium 131 (*) 135 - 145 mEq/L   Potassium 3.1  (*) 3.5 - 5.1 mEq/L   Chloride 94 (*) 96 - 112 mEq/L   CO2 25  19 - 32 mEq/L   Glucose, Bld 94  70 - 99 mg/dL  BUN 5 (*) 6 - 23 mg/dL   Creatinine, Ser 8.29  0.50 - 1.10 mg/dL   Calcium 8.8  8.4 - 56.2 mg/dL   Total Protein 6.5  6.0 - 8.3 g/dL   Albumin 3.0 (*) 3.5 - 5.2 g/dL   AST 19  0 - 37 U/L   ALT 7  0 - 35 U/L   Alkaline Phosphatase 91  39 - 117 U/L   Total Bilirubin 0.3  0.3 - 1.2 mg/dL   GFR calc non Af Amer >90  >90 mL/min   GFR calc Af Amer >90  >90 mL/min  LIPASE, BLOOD      Result Value Range   Lipase 22  11 - 59 U/L  LACTIC ACID, PLASMA      Result Value Range   Lactic Acid, Venous 0.8  0.5 - 2.2 mmol/L  URINE MICROSCOPIC-ADD ON      Result Value Range   Squamous Epithelial / LPF FEW (*) RARE   RBC / HPF 0-2  <3 RBC/hpf  OCCULT BLOOD, POC DEVICE      Result Value Range   Fecal Occult Bld NEGATIVE  NEGATIVE   Dg Chest 2 View 05/29/2013   *RADIOLOGY REPORT*  Clinical Data:  Nausea, vomiting and fever.  CHEST - 2 VIEW  Comparison: None  Findings: The heart size and mediastinal contours are within normal limits.  Both lungs are clear.  The visualized skeletal structures are unremarkable.  IMPRESSION: No active disease.   Original Report Authenticated By: Irish Lack, M.D.   US Abdomen Complete 05/29/2013    *RADIOLOGY REPORT*  Clinical Data:  Abnormal CT scan.  Nausea and vomiting.  Diarrhea  ABDOMINAL ULTRASOUND COMPLETE  Comparison:  05/29/2013  Findings:  Gallbladder:  No gallbladder wall thickening.  No stones or sludge identified.  Negative sonographic Murphy's sign.  Common Bile Duct:  The common bile duct is increased in caliber measuring up to 14 mm.  Liver: Mild intrahepatic biliary dilatation is identified  IVC:  Appears normal.  Pancreas:  No abnormality identified. Pancreatic duct is upper limits of normal in size measuring 3 mm.  Spleen:  Within normal limits in size and echotexture.  Right kidney:  Normal in size and parenchymal echogenicity.  No evidence of  mass or hydronephrosis.  Left kidney:  Normal in size and parenchymal echogenicity.  No evidence of mass or hydronephrosis.  Abdominal Aorta:  No aneurysm identified.  IMPRESSION:  1.  No gallstones or evidence of cholecystitis. 2.  Common bile duct dilatation.  The distal common bile duct is not visualize due to overlying bowel gas.  Consider further evaluation with MRCP.   Original Report Authenticated By: Signa Kell, M.D.   Ct Abdomen Pelvis W Contrast 05/29/2013   *RADIOLOGY REPORT*  Clinical Data: Nausea, vomiting, diarrhea and fever.  CT ABDOMEN AND PELVIS WITH CONTRAST  Technique:  Multidetector CT imaging of the abdomen and pelvis was performed following the standard protocol during bolus administration of intravenous contrast.  Contrast: OMNIPAQUE IOHEXOL 300 MG/ML  SOLN  Comparison: None.  Findings: The common bile duct is dilated, measuring up to 13 mm in diameter.  The duct tapers at the level of the pancreatic head and obvious mass or ductal calculus is not identified.  Recommend correlation with liver function tests.  There is some associated distention of the gallbladder without visible gallbladder inflammation.  Minimally prominent central intrahepatic ducts are noted without evidence of true intrahepatic biliary ductal dilatation.  No focal liver lesions  are identified.  The pancreas, spleen, adrenal glands and kidneys are within normal limits.  There is fluid throughout much of the colon which is not overtly thickened or dilated.  Findings may be consistent with some degree of enteritis.  The small bowel is unremarkable.  No focal inflammatory process, free fluid or abscess is identified.  There is no evidence of mass or enlarged lymph nodes.  The uterus has been removed.  The bladder is unremarkable.  There is a tiny ventral hernia present in the midline upper abdomen containing fat.  Bony structures show significant degenerative disc disease at L4-5 with associated mild anterolisthesis  of L4-L5.  IMPRESSION:  1.  Unexplained dilatation of the common bile duct up to 13 mm and suggestion of some distention of the gallbladder.  An obstructing process is not definitely identified by CT.  Recommend correlation with liver function tests. 2.  Fluid throughout the colon which is not overtly thickened or dilated.  This may be consistent with enteritis. 3.  Tiny midline ventral hernia in the upper abdomen containing fat.   Original Report Authenticated By: Irish Lack, M.D.    1345:  Pt states the [place] abd made her RUQ "sore."  Pain meds ordered. Has tol PO without N/V while in the ED. Has stooled several times. Fever improved after APAP.  +CBD on CT scan and [place], but no clear mass or stone visualized.  Dx and testing d/w pt and family.  Questions answered.  Verb understanding, agreeable to admit. T/C to General Surgery Dr. Andrey Campanile, case discussed, including:  HPI, pertinent PM/SHx, VS/PE, dx testing, ED course and treatment:  Requests to admit to medicine service, will need GI consult; they can consult Surgery prn. T/C to [facility] Dr. Jerral Ralph, case discussed, including:  HPI, pertinent PM/SHx, VS/PE, dx testing, ED course and treatment:  Agreeable to admit, requests to write temporary orders, obtain medical bed to team 3.          Laray Anger, DO 05/31/13 1746

## 2013-05-29 NOTE — ED Notes (Signed)
Admitting Doctor at bedside 

## 2013-05-30 ENCOUNTER — Inpatient Hospital Stay (HOSPITAL_COMMUNITY): Payer: 59

## 2013-05-30 DIAGNOSIS — F329 Major depressive disorder, single episode, unspecified: Secondary | ICD-10-CM

## 2013-05-30 LAB — COMPREHENSIVE METABOLIC PANEL
ALT: 6 U/L (ref 0–35)
AST: 16 U/L (ref 0–37)
Albumin: 2.9 g/dL — ABNORMAL LOW (ref 3.5–5.2)
Alkaline Phosphatase: 78 U/L (ref 39–117)
Glucose, Bld: 82 mg/dL (ref 70–99)
Potassium: 3.7 mEq/L (ref 3.5–5.1)
Sodium: 134 mEq/L — ABNORMAL LOW (ref 135–145)
Total Protein: 6 g/dL (ref 6.0–8.3)

## 2013-05-30 LAB — CBC
HCT: 36.2 % (ref 36.0–46.0)
MCHC: 33.4 g/dL (ref 30.0–36.0)
MCV: 90.7 fL (ref 78.0–100.0)
RDW: 13.4 % (ref 11.5–15.5)

## 2013-05-30 LAB — URINE CULTURE

## 2013-05-30 MED ORDER — GADOBENATE DIMEGLUMINE 529 MG/ML IV SOLN
17.0000 mL | Freq: Once | INTRAVENOUS | Status: AC | PRN
  Administered 2013-05-30: 17 mL via INTRAVENOUS

## 2013-05-30 NOTE — Progress Notes (Signed)
Utilization Review Completed.Kathryn Castaneda T6/22/2014  

## 2013-05-30 NOTE — Progress Notes (Signed)
[facility] PROGRESS NOTE  Lonetta Blassingame HYQ:[id number] DOB: 1961/11/28 DOA: 05/29/2013 PCP: No primary provider on file.  Assessment/Plan: Nausea/Vomiting/Dairrhea -2/2 enteritis (?norovirus): unclear if viral or possibly bacterial source. -Remains on cipro/flagyl; can consider D/C'ing them in am. -C Diff PCR was negative. -Stool bacterial cultures pending at this time. -Avoid antimotility agents for her diarrhea until infection has been ruled out.  Biliary Ductal Dilatation -Without LFT abnormality. Appreciate GI input. -Plan for MRCP: if negative no need for further work up unless LFTs remain negative.  Hypokalemia -Repleted.  Code Status: Full code  Family Communication: Patient only  Disposition Plan: Home when ready 24-48 hours.   Consultants:  GI, Dr. Bosie Clos   Antibiotics:  Cipro day 2  Flagyl day 2   Subjective: Still complains of diarrhea, n/v improved.  Objective: Filed Vitals:   05/29/13 2129 05/30/13 0215 05/30/13 0545 05/30/13 0930  BP: 111/78 99/67 106/73 93/59  Pulse: 90 83 80 65  Temp: 100.4 F (38 C) 99.1 F (37.3 C) 99.5 F (37.5 C) 98.1 F (36.7 C)  TempSrc: Oral   Oral  Resp: 20 19 18 12   Height:      Weight:      SpO2: 99% 97% 100% 94%    Intake/Output Summary (Last 24 hours) at 05/30/13 1323 Last data filed at 05/30/13 0521  Gross per 24 hour  Intake   2100 ml  Output      0 ml  Net   2100 ml   Filed Weights   05/29/13 1609  Weight: 77.111 kg (170 lb)    Exam:   General:  AA Ox3, NAD  Cardiovascular: RRR, no M/R/G  Respiratory: CTA B  Abdomen: S/NT/ND/+BS  Extremities: no C/C/E/+pedal pulses/    Neurologic:  Grossly intact and non-focal.  Data Reviewed: Basic Metabolic Panel:  Recent Labs Lab 05/29/13 0750 05/29/13 1654 05/30/13 0435  NA 131*  --  134*  K 3.1*  --  3.7  CL 94*  --  102  CO2 25  --  24  GLUCOSE 94  --  82  BUN 5*  --  <3*  CREATININE 0.73 0.70 0.81  CALCIUM 8.8  --  8.8    Liver Function Tests:  Recent Labs Lab 05/29/13 0750 05/30/13 0435  AST 19 16  ALT 7 6  ALKPHOS 91 78  BILITOT 0.3 0.3  PROT 6.5 6.0  ALBUMIN 3.0* 2.9*    Recent Labs Lab 05/29/13 0750  LIPASE 22   No results found for this basename: AMMONIA,  in the last 168 hours CBC:  Recent Labs Lab 05/29/13 0750 05/29/13 1654 05/30/13 0435  WBC 4.2 3.7* 4.3  NEUTROABS 3.2  --   --   HGB 13.5 13.5 12.1  HCT 38.7 39.2 36.2  MCV 88.6 89.5 90.7  PLT 157 139* 164   Cardiac Enzymes: No results found for this basename: CKTOTAL, CKMB, CKMBINDEX, TROPONINI,  in the last 168 hours BNP (last 3 results) No results found for this basename: PROBNP,  in the last 8760 hours CBG: No results found for this basename: GLUCAP,  in the last 168 hours  Recent Results (from the past 240 hour(s))  CLOSTRIDIUM DIFFICILE BY PCR     Status: None   Collection Time    05/29/13  8:24 AM      Result Value Range Status   C difficile by pcr NEGATIVE  NEGATIVE Final     Studies: Dg Chest 2 View  05/29/2013   *RADIOLOGY REPORT*  Clinical Data:  Nausea, vomiting and fever.  CHEST - 2 VIEW  Comparison: None  Findings: The heart size and mediastinal contours are within normal limits.  Both lungs are clear.  The visualized skeletal structures are unremarkable.  IMPRESSION: No active disease.   Original Report Authenticated By: Irish Lack, M.D.   US Abdomen Complete  05/29/2013    *RADIOLOGY REPORT*  Clinical Data:  Abnormal CT scan.  Nausea and vomiting.  Diarrhea  ABDOMINAL ULTRASOUND COMPLETE  Comparison:  05/29/2013  Findings:  Gallbladder:  No gallbladder wall thickening.  No stones or sludge identified.  Negative sonographic Murphy's sign.  Common Bile Duct:  The common bile duct is increased in caliber measuring up to 14 mm.  Liver: Mild intrahepatic biliary dilatation is identified  IVC:  Appears normal.  Pancreas:  No abnormality identified. Pancreatic duct is upper limits of normal in size  measuring 3 mm.  Spleen:  Within normal limits in size and echotexture.  Right kidney:  Normal in size and parenchymal echogenicity.  No evidence of mass or hydronephrosis.  Left kidney:  Normal in size and parenchymal echogenicity.  No evidence of mass or hydronephrosis.  Abdominal Aorta:  No aneurysm identified.  IMPRESSION:  1.  No gallstones or evidence of cholecystitis. 2.  Common bile duct dilatation.  The distal common bile duct is not visualize due to overlying bowel gas.  Consider further evaluation with MRCP.   Original Report Authenticated By: Signa Kell, M.D.   Ct Abdomen Pelvis W Contrast  05/29/2013   *RADIOLOGY REPORT*  Clinical Data: Nausea, vomiting, diarrhea and fever.  CT ABDOMEN AND PELVIS WITH CONTRAST  Technique:  Multidetector CT imaging of the abdomen and pelvis was performed following the standard protocol during bolus administration of intravenous contrast.  Contrast: OMNIPAQUE IOHEXOL 300 MG/ML  SOLN  Comparison: None.  Findings: The common bile duct is dilated, measuring up to 13 mm in diameter.  The duct tapers at the level of the pancreatic head and obvious mass or ductal calculus is not identified.  Recommend correlation with liver function tests.  There is some associated distention of the gallbladder without visible gallbladder inflammation.  Minimally prominent central intrahepatic ducts are noted without evidence of true intrahepatic biliary ductal dilatation.  No focal liver lesions are identified.  The pancreas, spleen, adrenal glands and kidneys are within normal limits.  There is fluid throughout much of the colon which is not overtly thickened or dilated.  Findings may be consistent with some degree of enteritis.  The small bowel is unremarkable.  No focal inflammatory process, free fluid or abscess is identified.  There is no evidence of mass or enlarged lymph nodes.  The uterus has been removed.  The bladder is unremarkable.  There is a tiny ventral hernia  present in the midline upper abdomen containing fat.  Bony structures show significant degenerative disc disease at L4-5 with associated mild anterolisthesis of L4-L5.  IMPRESSION:  1.  Unexplained dilatation of the common bile duct up to 13 mm and suggestion of some distention of the gallbladder.  An obstructing process is not definitely identified by CT.  Recommend correlation with liver function tests. 2.  Fluid throughout the colon which is not overtly thickened or dilated.  This may be consistent with enteritis. 3.  Tiny midline ventral hernia in the upper abdomen containing fat.   Original Report Authenticated By: Irish Lack, M.D.    Scheduled Meds: . ciprofloxacin  400 mg Intravenous Q12H  . enoxaparin (  LOVENOX) injection  40 mg Subcutaneous Q24H  . levothyroxine  100 mcg Oral QAC breakfast  . metronidazole  500 mg Intravenous Q8H  . pantoprazole  40 mg Oral Q breakfast  . PARoxetine  60 mg Oral [id number]  . propranolol  20 mg Oral BID  . saccharomyces boulardii  250 mg Oral BID   Continuous Infusions: . sodium chloride 150 mL/hr at 05/29/13 1530  . sodium chloride 0.9 % 1,000 mL with potassium chloride 20 mEq infusion 150 mL/hr at 05/30/13 1048    Principal Problem:   Enteritis Active Problems:   Hypokalemia   Depression   Migraine   Dilation of biliary tract    Time spent: 35 minutes.    Chaya Jan  [facility] Pager [phone]  If 7PM-7AM, please contact night-coverage at [email or web address], password [email or web address] 05/30/2013, 1:23 PM  LOS: 1 day

## 2013-05-30 NOTE — Progress Notes (Signed)
MD notified of bp 90/64, blood in stool. No orders at this time. Patient going for MRCP now.

## 2013-05-31 LAB — COMPREHENSIVE METABOLIC PANEL
ALT: 6 U/L (ref 0–35)
CO2: 24 mEq/L (ref 19–32)
Calcium: 8.7 mg/dL (ref 8.4–10.5)
Creatinine, Ser: 0.69 mg/dL (ref 0.50–1.10)
GFR calc Af Amer: >90 mL/min (ref >90)
GFR calc non Af Amer: >90 mL/min (ref >90)
Glucose, Bld: 86 mg/dL (ref 70–99)
Total Bilirubin: 0.2 mg/dL — ABNORMAL LOW (ref 0.3–1.2)

## 2013-05-31 LAB — GI PATHOGEN PANEL BY PCR, STOOL
C difficile toxin A/B: NEGATIVE
Campylobacter by PCR: NEGATIVE
E coli (STEC): NEGATIVE
G lamblia by PCR: NEGATIVE
Rotavirus A by PCR: NEGATIVE

## 2013-05-31 LAB — CBC
Hemoglobin: 11.6 g/dL — ABNORMAL LOW (ref 12.0–15.0)
MCHC: 33.5 g/dL (ref 30.0–36.0)
Platelets: 154 10*3/uL (ref 150–400)
RBC: 3.83 MIL/uL — ABNORMAL LOW (ref 3.87–5.11)

## 2013-05-31 MED ORDER — SACCHAROMYCES BOULARDII 250 MG PO CAPS: 250.0000 mg | ORAL_CAPSULE | Freq: Two times a day (BID) | ORAL | Status: DC

## 2013-05-31 MED ORDER — METRONIDAZOLE 500 MG PO TABS: 500.0000 mg | ORAL_TABLET | Freq: Three times a day (TID) | ORAL | Status: DC

## 2013-05-31 MED ORDER — CIPROFLOXACIN HCL 250 MG PO TABS: 250.0000 mg | ORAL_TABLET | Freq: Two times a day (BID) | ORAL | Status: DC

## 2013-05-31 NOTE — Progress Notes (Signed)
The patient complains of diarrhea. She would like to advance her diet. She states that she had a colonoscopy about 3 years ago and it was normal. No further complaints of vomiting. Stool for Clostridium difficile toxin was negative. Stool for GI pathogens panel is pending.  MRCP shows mild extra hepatic biliary dilatation, with an abrupt cut off of the common bile duct just above the ampulla. Of note is that liver enzymes are normal. The etiology of this is unclear. I would recommend that we follow this up at a later date with consideration of endoscopic ultrasound with Dr. Dulce Sellar.

## 2013-05-31 NOTE — Discharge Summary (Signed)
Physician Discharge Summary  Kathryn Castaneda ZOX:[id number] DOB: [birth year] DOA: 05/29/2013  PCP: No primary provider on file.  Admit date: 05/29/2013 Discharge date: 05/31/2013  Time spent: Greater than 30 minutes  Recommendations for Outpatient Follow-up:  -Advised to follow up with PCP in 2 weeks.  Discharge Diagnoses:  Principal Problem:   Enteritis Active Problems:   Hypokalemia   Depression   Migraine   Dilation of biliary tract   Discharge Condition: Stable and improved  Filed Weights   05/29/13 1609  Weight: 77.111 kg (170 lb)    History of present illness:  Patient is a 52 y.o. female with a Past Medical History of depression, migraine headaches, lactose intolerance ADHD who presents today with the above noted complaint. Patient works as a Radiation protection practitioner for [employer], she is apparently in her usual state of health approximately 4 days ago when she started having vomiting and loose watery stools. She claims that her stools are watery and she goes maybe 10-15 times a day. There is no blood or mucus in the stools. She's had numerous episodes of vomiting the past 4 days, however the vomiting seems to have slowly resolved, her last vomitus was yesterday morning. She claims she's had fever as high as 102F at home. She presented to the emergency room for evaluation, where she was found to be febrile, she was not found to have any leukocytosis. She then underwent a CT scan of the abdomen which showed dilatation of her common bile duct, without any evidence of obstruction on the CT scan. She also had normal liver function tests and normal lipase. I was subsequently asked to admit this patient for further evaluation and treatment.  Patient has had no recent sick contacts, she has had no recent travel. None of her family members or friends have a similar illness. She claims she does get abdominal cramping/pain with bowel movements, however otherwise she does not have any abdominal pain.  There is  also no history of shortness of breath or chest pain. We were asked to admit her for further evaluation and management.   Hospital Course:   Nausea/Vomiting/Dairrhea  -2/2 enteritis (?norovirus): unclear if viral or possibly bacterial source.  -Stool for GI pathogens is still pending at time of DC. -Will discharge on 7 days of cipro/flagyl in case bacterial in origin. -C Diff PCR was negative.  -n/v has resolved and diarrhea is much improved. -She would like a diet today. -Will DC home later today if she can tolerate a solid diet.  Biliary Ductal Dilatation  -Without LFT abnormality.  Appreciate GI input.  -MRCP with : mild extra hepatic biliary dilatation, with an abrupt cut off of the common bile duct just above the ampulla. Of note is that liver enzymes are normal. The etiology of this is unclear. GIrecommends that we follow this up at a later date with consideration of endoscopic ultrasound .   Hypokalemia  -Repleted.   Procedures:  None   Consultations:  GI, Dr. Evette Cristal  Discharge Instructions  Discharge Orders   Future Orders Complete By Expires     Diet - low sodium heart healthy  As directed     Discontinue IV  As directed     Increase activity slowly  As directed         Medication List    TAKE these medications       amphetamine-dextroamphetamine 25 MG 24 hr capsule  Commonly known as:  ADDERALL XR  Take 25 mg by mouth every morning.  ciprofloxacin 250 MG tablet  Commonly known as:  CIPRO  Take 1 tablet (250 mg total) by mouth 2 (two) times daily. For 7 days     clonazePAM 1 MG tablet  Commonly known as:  KLONOPIN  Take 1 mg by mouth daily as needed for anxiety.     levothyroxine 100 MCG tablet  Commonly known as:  SYNTHROID, LEVOTHROID  Take 100 mcg by mouth daily before breakfast.     metroNIDAZOLE 500 MG tablet  Commonly known as:  FLAGYL  Take 1 tablet (500 mg total) by mouth 3 (three) times daily. For 7 days     omeprazole 20 MG capsule   Commonly known as:  PRILOSEC  Take 20 mg by mouth daily.     PARoxetine 30 MG tablet  Commonly known as:  PAXIL  Take 60 mg by mouth every morning.     propranolol 20 MG tablet  Commonly known as:  INDERAL  Take 20 mg by mouth 2 (two) times daily.     saccharomyces boulardii 250 MG capsule  Commonly known as:  FLORASTOR  Take 1 capsule (250 mg total) by mouth 2 (two) times daily.     SUMAtriptan 100 MG tablet  Commonly known as:  IMITREX  Take 100 mg by mouth every 2 (two) hours as needed for migraine.       No Known Allergies     Follow-up Information   Schedule an appointment as soon as possible for a visit in 2 weeks to follow up. (With your primary care doctor)        The results of significant diagnostics from this hospitalization (including imaging, microbiology, ancillary and laboratory) are listed below for reference.    Significant Diagnostic Studies: Dg Chest 2 View  05/29/2013   *RADIOLOGY REPORT*  Clinical Data:  Nausea, vomiting and fever.  CHEST - 2 VIEW  Comparison: None  Findings: The heart size and mediastinal contours are within normal limits.  Both lungs are clear.  The visualized skeletal structures are unremarkable.  IMPRESSION: No active disease.   Original Report Authenticated By: Irish Lack, M.D.   US Abdomen Complete  05/29/2013    *RADIOLOGY REPORT*  Clinical Data:  Abnormal CT scan.  Nausea and vomiting.  Diarrhea  ABDOMINAL ULTRASOUND COMPLETE  Comparison:  05/29/2013  Findings:  Gallbladder:  No gallbladder wall thickening.  No stones or sludge identified.  Negative sonographic Murphy's sign.  Common Bile Duct:  The common bile duct is increased in caliber measuring up to 14 mm.  Liver: Mild intrahepatic biliary dilatation is identified  IVC:  Appears normal.  Pancreas:  No abnormality identified. Pancreatic duct is upper limits of normal in size measuring 3 mm.  Spleen:  Within normal limits in size and echotexture.  Right kidney:  Normal in  size and parenchymal echogenicity.  No evidence of mass or hydronephrosis.  Left kidney:  Normal in size and parenchymal echogenicity.  No evidence of mass or hydronephrosis.  Abdominal Aorta:  No aneurysm identified.  IMPRESSION:  1.  No gallstones or evidence of cholecystitis. 2.  Common bile duct dilatation.  The distal common bile duct is not visualize due to overlying bowel gas.  Consider further evaluation with MRCP.   Original Report Authenticated By: Signa Kell, M.D.   Ct Abdomen Pelvis W Contrast  05/29/2013   *RADIOLOGY REPORT*  Clinical Data: Nausea, vomiting, diarrhea and fever.  CT ABDOMEN AND PELVIS WITH CONTRAST  Technique:  Multidetector CT imaging of the abdomen  and pelvis was performed following the standard protocol during bolus administration of intravenous contrast.  Contrast: OMNIPAQUE IOHEXOL 300 MG/ML  SOLN  Comparison: None.  Findings: The common bile duct is dilated, measuring up to 13 mm in diameter.  The duct tapers at the level of the pancreatic head and obvious mass or ductal calculus is not identified.  Recommend correlation with liver function tests.  There is some associated distention of the gallbladder without visible gallbladder inflammation.  Minimally prominent central intrahepatic ducts are noted without evidence of true intrahepatic biliary ductal dilatation.  No focal liver lesions are identified.  The pancreas, spleen, adrenal glands and kidneys are within normal limits.  There is fluid throughout much of the colon which is not overtly thickened or dilated.  Findings may be consistent with some degree of enteritis.  The small bowel is unremarkable.  No focal inflammatory process, free fluid or abscess is identified.  There is no evidence of mass or enlarged lymph nodes.  The uterus has been removed.  The bladder is unremarkable.  There is a tiny ventral hernia present in the midline upper abdomen containing fat.  Bony structures show significant degenerative disc  disease at L4-5 with associated mild anterolisthesis of L4-L5.  IMPRESSION:  1.  Unexplained dilatation of the common bile duct up to 13 mm and suggestion of some distention of the gallbladder.  An obstructing process is not definitely identified by CT.  Recommend correlation with liver function tests. 2.  Fluid throughout the colon which is not overtly thickened or dilated.  This may be consistent with enteritis. 3.  Tiny midline ventral hernia in the upper abdomen containing fat.   Original Report Authenticated By: Irish Lack, M.D.   Mr 3d Recon At Scanner  05/31/2013   *RADIOLOGY REPORT*  Clinical Data:  Biliary dilatation with fever and nausea.  MRI ABDOMEN WITHOUT AND WITH CONTRAST (MRCP)  Technique:  Multiplanar multisequence MR imaging of the abdomen was performed without and with contrast, including heavily T2-weighted images of the biliary and pancreatic ducts.  Three-dimensional MR images were rendered by post processing of the original MR data.  Contrast: 17mL MULTIHANCE GADOBENATE DIMEGLUMINE 529 MG/ML IV SOLN  Comparison:  CT scan from 05/29/2013  Findings:  There is mild intrahepatic biliary duct dilatation.  The extrahepatic common duct measures 12 mm in diameter.  The common bile duct in the head of the pancreas measures 10 mm in diameter. There is rapid tapering of the common bile duct just proximal to the ampulla.  No associated pancreatic ductal dilatation.  No evidence for pancreatic head mass.  No ampullary mass is visible.  There is a tiny amount of fluid adjacent the gallbladder.  No evidence for gallstones.  No evidence for stones within the biliary ducts.  No focal abnormality in the liver or spleen.  The stomach, duodenum, pancreas, adrenal glands, and upper kidneys are normal.  No abdominal aortic aneurysm.  No lymphadenopathy in the abdomen. The portal vein is patent.  The fat around the celiac axis and superior mesenteric artery is preserved.  No abnormal marrow enhancement within  the visualized skeleton.  IMPRESSION: Mild extrahepatic biliary duct dilatation with fairly abrupt smooth tapering of the distal common bile duct just proximal to the ampulla.  There is no associated pancreatic ductal dilatation.  No mass lesion is identified in the head of the pancreas.  No evidence for cholelithiasis or choledocolithiasis.   Original Report Authenticated By: Kennith Center, M.D.   Mr Roe Coombs  W/wo Cm/mrcp  05/31/2013   *RADIOLOGY REPORT*  Clinical Data:  Biliary dilatation with fever and nausea.  MRI ABDOMEN WITHOUT AND WITH CONTRAST (MRCP)  Technique:  Multiplanar multisequence MR imaging of the abdomen was performed without and with contrast, including heavily T2-weighted images of the biliary and pancreatic ducts.  Three-dimensional MR images were rendered by post processing of the original MR data.  Contrast: 17mL MULTIHANCE GADOBENATE DIMEGLUMINE 529 MG/ML IV SOLN  Comparison:  CT scan from 05/29/2013  Findings:  There is mild intrahepatic biliary duct dilatation.  The extrahepatic common duct measures 12 mm in diameter.  The common bile duct in the head of the pancreas measures 10 mm in diameter. There is rapid tapering of the common bile duct just proximal to the ampulla.  No associated pancreatic ductal dilatation.  No evidence for pancreatic head mass.  No ampullary mass is visible.  There is a tiny amount of fluid adjacent the gallbladder.  No evidence for gallstones.  No evidence for stones within the biliary ducts.  No focal abnormality in the liver or spleen.  The stomach, duodenum, pancreas, adrenal glands, and upper kidneys are normal.  No abdominal aortic aneurysm.  No lymphadenopathy in the abdomen. The portal vein is patent.  The fat around the celiac axis and superior mesenteric artery is preserved.  No abnormal marrow enhancement within the visualized skeleton.  IMPRESSION: Mild extrahepatic biliary duct dilatation with fairly abrupt smooth tapering of the distal common bile duct  just proximal to the ampulla.  There is no associated pancreatic ductal dilatation.  No mass lesion is identified in the head of the pancreas.  No evidence for cholelithiasis or choledocolithiasis.   Original Report Authenticated By: Kennith Center, M.D.    Microbiology: Recent Results (from the past 240 hour(s))  URINE CULTURE     Status: None   Collection Time    05/29/13  8:23 AM      Result Value Range Status   Specimen Description URINE, CLEAN CATCH   Final   Special Requests NONE   Final   Culture  Setup Time 05/29/2013 15:25   Final   Colony Count 3,000 COLONIES/ML   Final   Culture INSIGNIFICANT GROWTH   Final   Report Status 05/30/2013 FINAL   Final  CLOSTRIDIUM DIFFICILE BY PCR     Status: None   Collection Time    05/29/13  8:24 AM      Result Value Range Status   C difficile by pcr NEGATIVE  NEGATIVE Final  STOOL CULTURE     Status: None   Collection Time    05/29/13  8:24 AM      Result Value Range Status   Specimen Description STOOL   Final   Special Requests NONE   Final   Culture Culture reincubated for better growth   Final   Report Status PENDING   Incomplete     Labs: Basic Metabolic Panel:  Recent Labs Lab 05/29/13 0750 05/29/13 1654 05/30/13 0435 05/31/13 0530  NA 131*  --  134* 139  K 3.1*  --  3.7 3.8  CL 94*  --  102 109  CO2 25  --  24 24  GLUCOSE 94  --  82 86  BUN 5*  --  <3* <3*  CREATININE 0.73 0.70 0.81 0.69  CALCIUM 8.8  --  8.8 8.7   Liver Function Tests:  Recent Labs Lab 05/29/13 0750 05/30/13 0435 05/31/13 0530  AST 19 16 17   ALT  7 6 6   ALKPHOS 91 78 69  BILITOT 0.3 0.3 0.2*  PROT 6.5 6.0 5.5*  ALBUMIN 3.0* 2.9* 2.5*    Recent Labs Lab 05/29/13 0750  LIPASE 22   No results found for this basename: AMMONIA,  in the last 168 hours CBC:  Recent Labs Lab 05/29/13 0750 05/29/13 1654 05/30/13 0435 05/31/13 0530  WBC 4.2 3.7* 4.3 2.9*  NEUTROABS 3.2  --   --   --   HGB 13.5 13.5 12.1 11.6*  HCT 38.7 39.2 36.2  34.6*  MCV 88.6 89.5 90.7 90.3  PLT 157 139* 164 154   Cardiac Enzymes: No results found for this basename: CKTOTAL, CKMB, CKMBINDEX, TROPONINI,  in the last 168 hours BNP: BNP (last 3 results) No results found for this basename: PROBNP,  in the last 8760 hours CBG: No results found for this basename: GLUCAP,  in the last 168 hours     Signed:  Chaya Jan  [facility] Pager: [phone] 05/31/2013, 3:38 PM

## 2013-05-31 NOTE — Progress Notes (Signed)
Discharge Note. Pt given discharge instructions and Rx information. Rx was called into pharmacy and pt will pick up. Pt has not questions at this time. Pt ready for discharge.

## 2013-07-06 LAB — STOOL CULTURE

## 2014-03-02 ENCOUNTER — Emergency Department (HOSPITAL_COMMUNITY): Payer: PRIVATE HEALTH INSURANCE

## 2014-03-02 ENCOUNTER — Encounter (HOSPITAL_COMMUNITY): Payer: Self-pay | Admitting: Emergency Medicine

## 2014-03-02 ENCOUNTER — Emergency Department (HOSPITAL_COMMUNITY)
Admission: EM | Admit: 2014-03-02 | Discharge: 2014-03-02 | Disposition: A | Discharge status: Home / Self Care | Payer: PRIVATE HEALTH INSURANCE | Attending: Emergency Medicine | Admitting: Emergency Medicine

## 2014-03-02 DIAGNOSIS — S39012A Strain of muscle, fascia and tendon of lower back, initial encounter: Secondary | ICD-10-CM

## 2014-03-02 DIAGNOSIS — R000 Tachycardia, unspecified: Secondary | ICD-10-CM | POA: Insufficient documentation

## 2014-03-02 DIAGNOSIS — Y929 Unspecified place or not applicable: Secondary | ICD-10-CM | POA: Insufficient documentation

## 2014-03-02 DIAGNOSIS — Z791 Long term (current) use of non-steroidal anti-inflammatories (NSAID): Secondary | ICD-10-CM | POA: Insufficient documentation

## 2014-03-02 DIAGNOSIS — F329 Major depressive disorder, single episode, unspecified: Secondary | ICD-10-CM | POA: Insufficient documentation

## 2014-03-02 DIAGNOSIS — Z79899 Other long term (current) drug therapy: Secondary | ICD-10-CM | POA: Insufficient documentation

## 2014-03-02 DIAGNOSIS — Y990 Civilian activity done for income or pay: Secondary | ICD-10-CM | POA: Insufficient documentation

## 2014-03-02 DIAGNOSIS — Y9389 Activity, other specified: Secondary | ICD-10-CM | POA: Insufficient documentation

## 2014-03-02 DIAGNOSIS — E079 Disorder of thyroid, unspecified: Secondary | ICD-10-CM | POA: Insufficient documentation

## 2014-03-02 DIAGNOSIS — X500XXA Overexertion from strenuous movement or load, initial encounter: Secondary | ICD-10-CM | POA: Insufficient documentation

## 2014-03-02 DIAGNOSIS — S335XXA Sprain of ligaments of lumbar spine, initial encounter: Secondary | ICD-10-CM | POA: Insufficient documentation

## 2014-03-02 DIAGNOSIS — K219 Gastro-esophageal reflux disease without esophagitis: Secondary | ICD-10-CM | POA: Insufficient documentation

## 2014-03-02 DIAGNOSIS — F3289 Other specified depressive episodes: Secondary | ICD-10-CM | POA: Insufficient documentation

## 2014-03-02 MED ORDER — KETOROLAC TROMETHAMINE 30 MG/ML IJ SOLN
30.0000 mg | Freq: Once | INTRAMUSCULAR | Status: AC
  Administered 2014-03-02: 30 mg via INTRAVENOUS
  Filled 2014-03-02: qty 1

## 2014-03-02 MED ORDER — ONDANSETRON 4 MG PO TBDP
ORAL_TABLET | ORAL | Status: AC
  Filled 2014-03-02: qty 1

## 2014-03-02 MED ORDER — MORPHINE SULFATE 4 MG/ML IJ SOLN
4.0000 mg | Freq: Once | INTRAMUSCULAR | Status: AC
Start: 2014-03-02 — End: 2014-03-02
  Administered 2014-03-02: 4 mg via INTRAVENOUS
  Filled 2014-03-02: qty 1

## 2014-03-02 MED ORDER — METHOCARBAMOL 500 MG PO TABS: 1000.0000 mg | ORAL_TABLET | Freq: Two times a day (BID) | ORAL | Status: DC

## 2014-03-02 MED ORDER — PROPRANOLOL HCL 20 MG PO TABS
20.0000 mg | ORAL_TABLET | Freq: Once | ORAL | Status: AC
  Administered 2014-03-02: 20 mg via ORAL
  Filled 2014-03-02 (×2): qty 1

## 2014-03-02 MED ORDER — ONDANSETRON 4 MG PO TBDP
4.0000 mg | ORAL_TABLET | Freq: Once | ORAL | Status: AC
  Administered 2014-03-02: 4 mg via ORAL

## 2014-03-02 MED ORDER — NAPROXEN 500 MG PO TABS: 500.0000 mg | ORAL_TABLET | Freq: Two times a day (BID) | ORAL | Status: DC

## 2014-03-02 NOTE — ED Provider Notes (Signed)
CSN: [id number]     Arrival date & time 03/02/14  0047 History   First MD Initiated Contact with Patient 03/02/14 0136     Chief Complaint  Patient presents with  . 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 States "feels like I pulled a muscle in my back". Patient admits to hx of spondylolisthesis in lumbar region. Patient describes a sharp squeezing pain without radiation. The patient has no upper back or neck pain, no fever, no hx of cancer, IVDU, recent spinal procedures, weakness or numbness of the lower extremities and no urinary complaints including no retention or incontinence. Patient denies any hx of kidney stones.  Past Medical History  Diagnosis Date  . Hyperlipemia   . Thyroid disease   . Depression   . Acid reflux    History reviewed. No pertinent past surgical history. No family history on file. History  Substance Use Topics  . Smoking status: Never Smoker   . Smokeless tobacco: Not on file  . Alcohol Use: Yes     Comment: occ   OB History   Grav Para Term Preterm Abortions TAB SAB Ect Mult Living                 Review of Systems  All other systems reviewed and are negative.      Allergies  Lactose intolerance (gi)  Home Medications   Current Outpatient Rx  Name  Route  Sig  Dispense  Refill  . amphetamine-dextroamphetamine (ADDERALL XR) 25 MG 24 hr capsule   Oral   Take 25 mg by mouth every morning.         . clonazePAM (KLONOPIN) 1 MG tablet   Oral   Take 1 mg by mouth daily as needed for anxiety (sleep).          Marland Kitchen levothyroxine (SYNTHROID, LEVOTHROID) 100 MCG tablet   Oral   Take 100 mcg by mouth daily before breakfast.         . meloxicam (MOBIC) 7.5 MG tablet   Oral   Take 7.5 mg by mouth daily.         . Multiple Vitamin (MULTIVITAMIN WITH  MINERALS) TABS tablet   Oral   Take 1 tablet by mouth daily.         Marland Kitchen omeprazole (PRILOSEC) 20 MG capsule   Oral   Take 20 mg by mouth daily.         Marland Kitchen PARoxetine (PAXIL) 30 MG tablet   Oral   Take 60 mg by mouth every morning.          . propranolol (INDERAL) 20 MG tablet   Oral   Take 20 mg by mouth 2 (two) times daily.          Marland Kitchen sulfaSALAzine (AZULFIDINE) 500 MG tablet   Oral   Take 500 mg by mouth 2 (two) times daily.         . SUMAtriptan (IMITREX) 100 MG tablet   Oral   Take 100 mg by mouth every 2 (two) hours as needed for migraine.         . methocarbamol (ROBAXIN) 500 MG tablet   Oral   Take 2 tablets (1,000 mg total) by mouth 2 (two) times daily.   20 tablet   0   . naproxen (NAPROSYN) 500 MG tablet  Oral   Take 1 tablet (500 mg total) by mouth 2 (two) times daily with a meal.   30 tablet   0    BP 109/75  Pulse 86  Resp 18  Ht 5\' 5"  (1.651 m)  Wt 180 lb (81.647 kg)  BMI 29.95 kg/m2  SpO2 99% Physical Exam  Nursing note and vitals reviewed. Constitutional: She is oriented to person, place, and time. She appears well-developed and well-nourished. No distress.  HENT:  Head: Normocephalic and atraumatic.  Mouth/Throat: Uvula is midline, oropharynx is clear and moist and mucous membranes are normal.  Eyes: Conjunctivae and EOM are normal. Pupils are equal, round, and reactive to light.  Neck: Trachea normal, normal range of motion, full passive range of motion without pain and phonation normal. Neck supple. No spinous process tenderness and no muscular tenderness present. No rigidity. No edema, no erythema and normal range of motion present.  Cardiovascular: Regular rhythm.  Tachycardia present.  Exam reveals no gallop and no friction rub.   No murmur heard. Pulmonary/Chest: Effort normal and breath sounds normal. No respiratory distress. She has no wheezes. She has no rhonchi. She has no rales.  Musculoskeletal: Normal range of motion. She  exhibits no edema.  Patient exhibits point tenderness at thoracolumbar junction. Negative SLR Bilaterally.  No Saddle anesthesia.  Good sensation to bilateral distal extremities.  Lower extremity strength 5/5 and equal bilaterally.   Neurological: She is alert and oriented to person, place, and time. She has normal strength. No cranial nerve deficit or sensory deficit.  CN II-XII grossly intact. Patient ambulatory in ED without difficulty after pain control.   Skin: Skin is warm and dry. She is not diaphoretic.  Psychiatric: She has a normal mood and affect. Her behavior is normal.    ED Course  Procedures (including critical care time) Labs Review Labs Reviewed - No data to display Imaging Review Dg Thoracic Spine W/swimmers  03/02/2014   CLINICAL DATA:  Low back pain.  EXAM: THORACIC SPINE - 2 VIEW + SWIMMERS  COMPARISON:  No priors.  FINDINGS: Three views of the thoracic spine demonstrate no acute displaced fracture or definite compression type fracture. Visualized portions of the bony thorax are intact. Mild multilevel degenerative disc disease.  IMPRESSION: 1. No acute radiographic abnormality of the thoracic spine.   Electronically Signed   By: 03/04/2014 M.D.   On: 03/02/2014 02:40   Dg Lumbar Spine Complete  03/02/2014   CLINICAL DATA:  Low back pain.  EXAM: LUMBAR SPINE - COMPLETE 4+ VIEW  COMPARISON:  CT of the abdomen and pelvis 05/29/2013.  FINDINGS: Five views of the lumbar spine demonstrate no definite acute displaced fracture or compression type fracture. Bilateral pars defects at L4 are noted. 7 mm of anterolisthesis of L4 upon L5. Extensive sclerosis of inferior endplate of L4 and superior endplate of L5, compatible with discogenic sclerosis. Alignment is otherwise anatomic. Otherwise, there is mild multilevel degenerative disc disease and facet arthropathy.  IMPRESSION: 1. No acute radiographic abnormality of the lumbar spine. 2. Grade 1 spondylolisthesis of L4 upon L5,  similar to prior CT scan 05/29/2013.   Electronically Signed   By: 05/31/2013 M.D.   On: 03/02/2014 02:43     EKG Interpretation None      MDM   Final diagnoses:  Low back strain   Patient tachycardic to 135 on admission. Appears in NAD.  Further questioning reveals patient has not taken her evening dose of Propranolol. Patient HR normalized  after pain control and evening dose of propranolol given in ED. Hx and exam consistent with low back strain. Plain films reveal no acute abnormalities of spine. Plan to treat patient symptoms and have her return to ED should she develop any worsening symptoms. Patient agrees with plan. Discharged in good condition.    Meds given in ED:  Medications  ondansetron (ZOFRAN-ODT) disintegrating tablet 4 mg (4 mg Oral Given 03/02/14 0127)  morphine 4 MG/ML injection 4 mg (4 mg Intravenous Given 03/02/14 0142)  ketorolac (TORADOL) 30 MG/ML injection 30 mg (30 mg Intravenous Given 03/02/14 0238)  propranolol (INDERAL) tablet 20 mg (20 mg Oral Given 03/02/14 0247)    Discharge Medication List as of 03/02/2014  2:58 AM    START taking these medications   Details  methocarbamol (ROBAXIN) 500 MG tablet Take 2 tablets (1,000 mg total) by mouth 2 (two) times daily., Starting 03/02/2014, Until Discontinued, Print    naproxen (NAPROSYN) 500 MG tablet Take 1 tablet (500 mg total) by mouth 2 (two) times daily with a meal., Starting 03/02/2014, Until Discontinued, Print           Allen Norris [place], PA-C 03/02/14 1811

## 2014-03-02 NOTE — Discharge Instructions (Signed)
Follow up with your primary care doctor in 2 days. Resource guide provided below for further follow up reference. Take medications as directed. Return to ED if you develop any worsening pain, leg weakness, numbness, or difficulty urinating.    Emergency Department Resource Guide 1) Find a Doctor and Pay Out of Pocket Although you won't have to find out who is covered by your insurance plan, it is a good idea to ask around and get recommendations. You will then need to call the office and see if the doctor you have chosen will accept you as a new patient and what types of options they offer for patients who are self-pay. Some doctors offer discounts or will set up payment plans for their patients who do not have insurance, but you will need to ask so you aren't surprised when you get to your appointment.  2) Contact Your Local Health Department Not all health departments have doctors that can see patients for sick visits, but many do, so it is worth a call to see if yours does. If you don't know where your local health department is, you can check in your phone book. The CDC also has a tool to help you locate your state's health department, and many state websites also have listings of all of their local health departments.  3) Find a Walk-in Clinic If your illness is not likely to be very severe or complicated, you may want to try a walk in clinic. These are popping up all over the country in pharmacies, drugstores, and shopping centers. They're usually staffed by nurse practitioners or physician assistants that have been trained to treat common illnesses and complaints. They're usually fairly quick and inexpensive. However, if you have serious medical issues or chronic medical problems, these are probably not your best option.  No Primary Care Doctor: - Call Health Connect at  [phone] - they can help you locate a primary care doctor that  accepts your insurance, provides certain services,  etc. - Physician Referral Service- [phone]  Chronic Pain Problems: Organization         Address  Phone   Notes  [facility]  [phone] Patients need to be referred by their primary care doctor.   Medication Assistance: Organization         Address  Phone   Notes  [facility] Medication [facility] [address] [place] [place], [place] [address] [phone] --Must be a resident of [facility] -- Must have NO insurance coverage whatsoever (no Medicaid/ Medicare, etc.) -- The pt. MUST have a primary care doctor that directs their care regularly and follows them in the community   [facility]  [phone]   [employer]  [phone]    Agencies that provide inexpensive medical care: Organization         Address  Phone   Notes  [facility]  [phone]   [facility]    [phone]   [facility] [address] [place], [place] [address] [phone]   [facility] [address] [place] [place] [phone]   Planned Parenthood    [phone]   [facility]    [phone]   [facility]  [address], [place] Phone:  [phone], Fax:  [phone] Hours of Operation:  9 am - 6 pm, M-F.  Also accepts Medicaid/Medicare and self-pay.  [facility]  [address], [place] Phone: [phone], Fax: [phone]. Hours of Operation:  8:30 am - 5:30 pm, M-F.  Also accepts Medicaid and self-pay.  [facility] [place] [address] [place] Phone: [phone]   [facility] [address] Natasha Bence [place], [place] [phone], Ext. [phone] Mondays & Thursdays: 7-9 AM.  First 15 patients are seen on a first come, first serve basis.    Medicaid-accepting [facility] Providers:  Organization         Address  Phone   Notes  [facility] [address], [place] [phone] Also accepts self-pay patients.  [facility] [address] Laurell Josephs [place], [place]  [phone]   [facility] [address], [place] [phone]   [facility] [address] [place] [phone]   Renaye Rakers [address], [place]   [phone] Only accepts [place] Access [place] patients after they have their name applied to their card.   Self-Pay (no insurance) in [facility]:  Organization         Address  Phone   Notes  Sickle Cell Patients, [facility] [address] [place], [place] [phone]   [facility] [address] [place], [place] [phone]   [facility] [place]  [address], [place] [phone]   [facility]/Dr. Osei-Bonsu  [address], [place] or [address], [place] [phone] Phone number for both [place] and [place] locations is the same.  [facility] [address] [place] [phone]   [facility] [address], [place] or [address] [phone]   [facility] [address], [place] [phone], phone; [phone], fax Sees patients 1st and 3rd Saturday of every month.  Must not qualify for public or private insurance (i.e. Medicaid, Medicare, [place] Health Choice, Veterans' Benefits)  Household income should be no more than 200% of the poverty level The clinic cannot treat you if you are pregnant or think you are pregnant  Sexually transmitted diseases are not treated at the clinic.    Dental Care: Organization         Address  Phone  Notes  [facility] Department of [facility] [address] [place], [place] [phone] Accepts children up to  age 80 who are enrolled in [place] or [place] Health Choice; pregnant women with a Medicaid card; and children who have applied for Medicaid or [place] Health Choice, but were declined, whose parents can pay a reduced fee at time of service.  [facility]  [address], [place] [phone] Accepts children up to age 17 who are enrolled in [place] or [place] Health Choice; pregnant women with a Medicaid card; and children who have applied for Medicaid or [place] Health Choice, but were declined, whose parents can pay a reduced fee at time of service.  [facility]  [address] [place], [place] [phone] Patients are seen by appointment only. Walk-ins are not accepted. [facility] will see patients 64 years of age and older. Monday - Tuesday (8am-5pm) Most Wednesdays (8:30-5pm) $30 per  visit, cash only  [facility] Adult [employer] PROGRAM  [address], [facility] [phone] Patients are seen by appointment only. Walk-ins are not accepted. [place] will see patients 25 years of age and older. One Wednesday Evening (Monthly: Volunteer Based).  $30 per visit, cash only  [place]  [phone] for adults; Children under age 88, call Graduate Pediatric Dentistry at [phone]. Children aged 53-14, please call [phone] to request a pediatric application.  Dental services are provided in all areas of dental care including fillings, crowns and bridges, complete and partial dentures, implants, gum treatment, root canals, and extractions. Preventive care is also provided. Treatment is provided to both adults and children. Patients are selected via a lottery and there is often a waiting list.   [facility] [address] [place]  [phone] [email or web address]   [facility] [address] [place], [place] [phone], Ext. [phone] Second and Fourth Thursday of  each month, opens at 6:30 AM; Clinic ends at 9 AM.  Patients are seen on a first-come first-served basis, and a limited number are seen during each clinic.   [facility]  [address] Hillard Danker [place], [place] [phone]   Eligibility Requirements You must have lived in [place], [place], or [place] counties for at least the last three months.   You cannot be eligible for state or federal sponsored [employer], including [employer], [place], or [employer].   You generally cannot be eligible for healthcare insurance through your employer.    How to apply: Eligibility screenings are held every Tuesday and Wednesday afternoon from 1:00 pm until 4:00 pm. You do not need an appointment for the interview!  [facility] [address], [place], [place]   [place]  [place] Department  [place]  [phone]    Behavioral Health Resources in the Community: Intensive Outpatient Programs Organization         Address  Phone  Notes  [place]. [address] [place], [place] [phone]   [facility] [address] [place], [place]   [facility]: [facility] [address] [place], [place]   [place] [address],  [place], [place] or [phone]   Substance Abuse Resources Organization         Address  Phone  Notes  [facility] Drug Services  [phone]   [place]  [phone]   [facility] [place]   Chinita Pester  [phone]   Residential & Outpatient Substance Abuse Program  [phone]   Psychological Services Organization         Address  Phone  Notes  [facility]  [place]  [phone]   [place] [address],  [place] or [phone]    Mobile Crisis Teams Organization         Address  Phone  Notes  [facility], Mobile Crisis Care Unit  [phone]   [facility]  [address] [place], [place]   [place] [address], [place] [phone]    Self-Help/Support Groups Organization         Address  Phone  Notes  [facility] [place] - variety of support groups  [place] Call for more information  Narcotics Anonymous (NA), [facility] [address], [employer] [place]  2 meetings at this location   Special educational needs teacher         Address  Phone  Notes  [facility] [place],    [place]  [phone]   [facility]  [address], [place] [phone], [place], [place]   [place], [place] [phone] Admissions: 8am-3pm M-F  Incentives Substance [place] [address],    [place], [place] [phone]   [facility] [address] [place], [place], [place]   [facility] [address]  [place], [place]   [facility] - Intensive Outpatient [address] Kristeen [address], [place], [place]   [facility] ([place]  [place], [place] [phone] or [phone]   [facility] ([facility]) [address] [place], [place] Accepts Medicaid  [facility] [address],  [place] [phone] Substance Abuse/Addiction Treatment   [facility] Organization         Address  Phone  Notes  [facility]  [phone]   Domenic Schwab, PhD [address] Arlis Porta [place], [place]   [phone] or [phone]   [place], [place] [phone]     [facility] [address], [place], [place] [phone] Insurance/Medicaid/sponsorship through [facility] and Families [address] [place]                                    [place], [place] [phone] [place] [address], [place] [phone]    Dr. Adele Schilder  [phone]   [facility] Dept. 1) [address], [place] 2) [place] 3)  [address], [place] [phone]  [phone]   [place] [phone] or [phone] (After Hours)

## 2014-03-02 NOTE — ED Notes (Signed)
Patient transported to X-ray 

## 2014-03-02 NOTE — ED Notes (Signed)
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  Admits to vomiting twice after injury.  Pt ambulatory without difficulty.

## 2014-03-03 NOTE — ED Provider Notes (Signed)
Medical screening examination/treatment/procedure(s) were performed by non-physician practitioner and as supervising physician I was immediately available for consultation/collaboration.   EKG Interpretation None       Derwood Kaplan, MD 03/03/14 [phone]

## 2015-01-16 ENCOUNTER — Other Ambulatory Visit: Payer: Self-pay

## 2015-01-16 DIAGNOSIS — Z1231 Encounter for screening mammogram for malignant neoplasm of breast: Secondary | ICD-10-CM

## 2015-01-30 ENCOUNTER — Ambulatory Visit: Payer: Self-pay

## 2015-02-07 ENCOUNTER — Ambulatory Visit: Payer: Self-pay

## 2015-02-15 ENCOUNTER — Ambulatory Visit
Admission: RE | Admit: 2015-02-15 | Discharge: 2015-02-15 | Disposition: A | Discharge status: Home / Self Care | Payer: 59 | Source: Ambulatory Visit

## 2015-02-15 DIAGNOSIS — Z1231 Encounter for screening mammogram for malignant neoplasm of breast: Secondary | ICD-10-CM

## 2015-06-16 ENCOUNTER — Emergency Department (HOSPITAL_COMMUNITY)
Admission: EM | Admit: 2015-06-16 | Discharge: 2015-06-16 | Disposition: A | Discharge status: Home / Self Care | Payer: 59 | Attending: Emergency Medicine | Admitting: Emergency Medicine

## 2015-06-16 ENCOUNTER — Encounter (HOSPITAL_COMMUNITY): Payer: Self-pay | Admitting: *Deleted

## 2015-06-16 DIAGNOSIS — Z79899 Other long term (current) drug therapy: Secondary | ICD-10-CM | POA: Insufficient documentation

## 2015-06-16 DIAGNOSIS — Y998 Other external cause status: Secondary | ICD-10-CM | POA: Diagnosis not present

## 2015-06-16 DIAGNOSIS — K219 Gastro-esophageal reflux disease without esophagitis: Secondary | ICD-10-CM | POA: Insufficient documentation

## 2015-06-16 DIAGNOSIS — Y9389 Activity, other specified: Secondary | ICD-10-CM | POA: Insufficient documentation

## 2015-06-16 DIAGNOSIS — E079 Disorder of thyroid, unspecified: Secondary | ICD-10-CM | POA: Insufficient documentation

## 2015-06-16 DIAGNOSIS — Z8619 Personal history of other infectious and parasitic diseases: Secondary | ICD-10-CM | POA: Insufficient documentation

## 2015-06-16 DIAGNOSIS — Z791 Long term (current) use of non-steroidal anti-inflammatories (NSAID): Secondary | ICD-10-CM | POA: Insufficient documentation

## 2015-06-16 DIAGNOSIS — F909 Attention-deficit hyperactivity disorder, unspecified type: Secondary | ICD-10-CM | POA: Insufficient documentation

## 2015-06-16 DIAGNOSIS — F329 Major depressive disorder, single episode, unspecified: Secondary | ICD-10-CM | POA: Insufficient documentation

## 2015-06-16 DIAGNOSIS — S4992XA Unspecified injury of left shoulder and upper arm, initial encounter: Secondary | ICD-10-CM | POA: Diagnosis not present

## 2015-06-16 DIAGNOSIS — Y9289 Other specified places as the place of occurrence of the external cause: Secondary | ICD-10-CM | POA: Diagnosis not present

## 2015-06-16 HISTORY — DX: Unspecified viral hepatitis C without hepatic coma: B19.20

## 2015-06-16 HISTORY — DX: Attention-deficit hyperactivity disorder, unspecified type: F90.9

## 2015-06-16 MED ORDER — METHOCARBAMOL 500 MG PO TABS: 1000.0000 mg | ORAL_TABLET | Freq: Two times a day (BID) | ORAL | Status: DC

## 2015-06-16 MED ORDER — TRAMADOL HCL 50 MG PO TABS: 50.0000 mg | ORAL_TABLET | Freq: Four times a day (QID) | ORAL | Status: DC | PRN

## 2015-06-16 NOTE — Discharge Instructions (Signed)
Rotator Cuff Injury Rotator cuff injury is any type of injury to the set of muscles and tendons that make up the stabilizing unit of your shoulder. This unit holds the ball of your upper arm bone (humerus) in the socket of your shoulder blade (scapula).  CAUSES Injuries to your rotator cuff most commonly come from sports or activities that cause your arm to be moved repeatedly over your head. Examples of this include throwing, weight lifting, swimming, or racquet sports. Long lasting (chronic) irritation of your rotator cuff can cause soreness and swelling (inflammation), bursitis, and eventual damage to your tendons, such as a tear (rupture). SIGNS AND SYMPTOMS Acute rotator cuff tear:  Sudden tearing sensation followed by severe pain shooting from your upper shoulder down your arm toward your elbow.  Decreased range of motion of your shoulder because of pain and muscle spasm.  Severe pain.  Inability to raise your arm out to the side because of pain and loss of muscle power (large tears). Chronic rotator cuff tear:  Pain that usually is worse at night and may interfere with sleep.  Gradual weakness and decreased shoulder motion as the pain worsens.  Decreased range of motion. Rotator cuff tendinitis:  Deep ache in your shoulder and the outside upper arm over your shoulder.  Pain that comes on gradually and becomes worse when lifting your arm to the side or turning it inward. DIAGNOSIS Rotator cuff injury is diagnosed through a medical history, physical exam, and imaging exam. The medical history helps determine the type of rotator cuff injury. Your health care provider will look at your injured shoulder, feel the injured area, and ask you to move your shoulder in different positions. X-ray exams typically are done to rule out other causes of shoulder pain, such as fractures. MRI is the exam of choice for the most severe shoulder injuries because the images show muscles and tendons.    TREATMENT  Chronic tear:  Medicine for pain, such as acetaminophen or ibuprofen.  Physical therapy and range-of-motion exercises may be helpful in maintaining shoulder function and strength.  Steroid injections into your shoulder joint.  Surgical repair of the rotator cuff if the injury does not heal with noninvasive treatment. Acute tear:  Anti-inflammatory medicines such as ibuprofen and naproxen to help reduce pain and swelling.  A sling to help support your arm and rest your rotator cuff muscles. Long-term use of a sling is not advised. It may cause significant stiffening of the shoulder joint.  Surgery may be considered within a few weeks, especially in younger, active people, to return the shoulder to full function.  Indications for surgical treatment include the following:  Age younger than 60 years.  Rotator cuff tears that are complete.  Physical therapy, rest, and anti-inflammatory medicines have been used for 6-8 weeks, with no improvement.  Employment or sporting activity that requires constant shoulder use. Tendinitis:  Anti-inflammatory medicines such as ibuprofen and naproxen to help reduce pain and swelling.  A sling to help support your arm and rest your rotator cuff muscles. Long-term use of a sling is not advised. It may cause significant stiffening of the shoulder joint.  Severe tendinitis may require:  Steroid injections into your shoulder joint.  Physical therapy.  Surgery. HOME CARE INSTRUCTIONS   Apply ice to your injury:  Put ice in a plastic bag.  Place a towel between your skin and the bag.  Leave the ice on for 20 minutes, 2-3 times a day.  If you   have a shoulder immobilizer (sling and straps), wear it until told otherwise by your health care provider.  You may want to sleep on several pillows or in a recliner at night to lessen swelling and pain.  Only take over-the-counter or prescription medicines for pain, discomfort, or fever as  directed by your health care provider.  Do simple hand squeezing exercises with a soft rubber ball to decrease hand swelling. SEEK MEDICAL CARE IF:   Your shoulder pain increases, or new pain or numbness develops in your arm, hand, or fingers.  Your hand or fingers are colder than your other hand. SEEK IMMEDIATE MEDICAL CARE IF:   Your arm, hand, or fingers are numb or tingling.  Your arm, hand, or fingers are increasingly swollen and painful, or they turn white or blue. MAKE SURE YOU:  Understand these instructions.  Will watch your condition.  Will get help right away if you are not doing well or get worse. Document Released: 11/22/2000 Document Revised: 11/30/2013 Document Reviewed: 07/07/2013 ExitCare Patient Information 2015 [employer]. This information is not intended to replace advice given to you by your health care provider. Make sure you discuss any questions you have with your health care provider.  

## 2015-06-16 NOTE — ED Notes (Signed)
Pt reports falling off her horse on wed and having left shoulder pain since.

## 2015-06-16 NOTE — ED Provider Notes (Signed)
CSN: [id number]     Arrival date & time 06/16/15  1518 History    This chart was scribed for non-physician practitioner Fayrene Helper, PA-C working with Samuel Jester, DO by Lyndel Safe, ED Scribe. This patient was seen in room TR06C/TR06C and the patient's care was started at 3:42 PM.    Chief Complaint  Patient presents with  . Fall  . Shoulder Pain    Patient is a 54 y.o. female presenting with shoulder pain. The history is provided by the patient. No language interpreter was used.  Shoulder Pain   HPI Comments: Kathryn Castaneda is a 54 y.o. female who presents to the Emergency Department complaining of a constant, aching left shoulder pain s/p fall that occurred 2 days ago. She reports she fell off a horse falling onto a muddy terrain, landing on her back. She felt her shoulder push forward upon the fall and has had an uncomfortable, painful feeling in her left shoulder since. She has tried applying ice and heat and taking ibuprofen and Advil with mild to no relief. She notes she has been wearing a shoulder sling with decreased ROM in left shoulder. Pt states she has a past history of dislocation to her left shoulder. She has been evaluated by an orthopedic doctor before for a knee issue. Pt is requesting medication to help her sleep. She denies a history of muscle spasms or known drug allergies.   Past Medical History  Diagnosis Date  . Hyperlipemia   . Thyroid disease   . Depression   . Acid reflux   . Hepatitis C   . ADHD (attention deficit hyperactivity disorder)    Past Surgical History  Procedure Laterality Date  . Joint replacement     History reviewed. No pertinent family history. History  Substance Use Topics  . Smoking status: Never Smoker   . Smokeless tobacco: Not on file  . Alcohol Use: Yes     Comment: occ   OB History    No data available     Review of Systems  Musculoskeletal: Positive for joint swelling and arthralgias.   Allergies  Lactose  intolerance (gi)  Home Medications   Prior to Admission medications   Medication Sig Start Date End Date Taking? Authorizing Provider  amphetamine-dextroamphetamine (ADDERALL XR) 25 MG 24 hr capsule Take 25 mg by mouth every morning.    Historical Provider, MD  clonazePAM (KLONOPIN) 1 MG tablet Take 1 mg by mouth daily as needed for anxiety (sleep).     Historical Provider, MD  levothyroxine (SYNTHROID, LEVOTHROID) 100 MCG tablet Take 100 mcg by mouth daily before breakfast.    Historical Provider, MD  meloxicam (MOBIC) 7.5 MG tablet Take 7.5 mg by mouth daily.    Historical Provider, MD  methocarbamol (ROBAXIN) 500 MG tablet Take 2 tablets (1,000 mg total) by mouth 2 (two) times daily. 03/02/14   Cristobal Goldmann, PA-C  Multiple Vitamin (MULTIVITAMIN WITH MINERALS) TABS tablet Take 1 tablet by mouth daily.    Historical Provider, MD  naproxen (NAPROSYN) 500 MG tablet Take 1 tablet (500 mg total) by mouth 2 (two) times daily with a meal. 03/02/14   Cristobal Goldmann, PA-C  omeprazole (PRILOSEC) 20 MG capsule Take 20 mg by mouth daily.    Historical Provider, MD  PARoxetine (PAXIL) 30 MG tablet Take 60 mg by mouth every morning.     Historical Provider, MD  propranolol (INDERAL) 20 MG tablet Take 20 mg by mouth 2 (two) times daily.  Historical Provider, MD  sulfaSALAzine (AZULFIDINE) 500 MG tablet Take 500 mg by mouth 2 (two) times daily.    Historical Provider, MD  SUMAtriptan (IMITREX) 100 MG tablet Take 100 mg by mouth every 2 (two) hours as needed for migraine.    Historical Provider, MD   BP 138/102 mmHg  Pulse 93  Temp(Src) 98.4 F (36.9 C) (Oral)  Resp 16  Ht 5\' 6"  (1.676 m)  Wt 183 lb (83.008 kg)  BMI 29.55 kg/m2  SpO2 97% Physical Exam  Constitutional: She is oriented to person, place, and time. She appears well-developed and well-nourished. No distress.  HENT:  Head: Normocephalic.  Right Ear: External ear normal.  Left Ear: External ear normal.  Mouth/Throat: No oropharyngeal  exudate.  Eyes: Pupils are equal, round, and reactive to light. Right eye exhibits no discharge. Left eye exhibits no discharge. No scleral icterus.  Neck: No JVD present.  Cardiovascular: Normal rate, regular rhythm and normal heart sounds.   Radial pulses 2+.   Pulmonary/Chest: Effort normal and breath sounds normal. No respiratory distress.  Musculoskeletal: She exhibits tenderness. She exhibits no edema.  Left shoulder; tenderness to the posterior subscapular infraspinatus region; tenderness to the left glenohumeral region. Positive Hawkin's test. ROM is limited secondary to pain.   Lymphadenopathy:    She has no cervical adenopathy.  Neurological: She is alert and oriented to person, place, and time.  Normal grip strength in left hand.   Skin: Skin is warm. No rash noted. No erythema. No pallor.  Psychiatric: She has a normal mood and affect. Her behavior is normal.  Nursing note and vitals reviewed.   ED Course  Procedures  DIAGNOSTIC STUDIES: Oxygen Saturation is 97% on RA, normal by my interpretation.    COORDINATION OF CARE: 3:48 PM suspect rotator cuff injury.  Discussed treatment plan with pt. Will give referral to [facility]. Pt refuses Xray of left shoulder. Pt acknowledges and agrees to plan.   Labs Review Labs Reviewed - No data to display  Imaging Review No results found.   EKG Interpretation None      MDM   Final diagnoses:  Shoulder injury, left, initial encounter    BP 138/102 mmHg  Pulse 93  Temp(Src) 98.4 F (36.9 C) (Oral)  Resp 16  Ht 5\' 6"  (1.676 m)  Wt 183 lb (83.008 kg)  BMI 29.55 kg/m2  SpO2 97%  I personally performed the services described in this documentation, which was scribed in my presence. The recorded information has been reviewed and is accurate.     [facility], PA-C 06/16/15 1615  Fayrene Helper, DO 06/18/15 1122

## 2015-09-13 ENCOUNTER — Ambulatory Visit: Payer: 59 | Admitting: Family Medicine

## 2015-12-11 MED FILL — LATUDA 60 MG TABLET: 60 | 30 days supply | Qty: 30 | Fill #2

## 2015-12-12 MED FILL — DEXTROAMP-AMPHET ER 20 MG C: 20 | 30 days supply | Qty: 30 | Fill #0

## 2015-12-13 MED FILL — clonazePAM 1 MG TABS: 1 | 30 days supply | Qty: 30 | Fill #0

## 2015-12-17 ENCOUNTER — Emergency Department (HOSPITAL_COMMUNITY): Payer: 59

## 2015-12-17 ENCOUNTER — Emergency Department (HOSPITAL_COMMUNITY)
Admission: EM | Admit: 2015-12-17 | Discharge: 2015-12-17 | Disposition: A | Discharge status: Home / Self Care | Payer: 59 | Attending: Emergency Medicine | Admitting: Emergency Medicine

## 2015-12-17 ENCOUNTER — Encounter (HOSPITAL_COMMUNITY): Payer: Self-pay | Admitting: *Deleted

## 2015-12-17 DIAGNOSIS — Y9289 Other specified places as the place of occurrence of the external cause: Secondary | ICD-10-CM | POA: Diagnosis not present

## 2015-12-17 DIAGNOSIS — K219 Gastro-esophageal reflux disease without esophagitis: Secondary | ICD-10-CM | POA: Diagnosis not present

## 2015-12-17 DIAGNOSIS — Z79899 Other long term (current) drug therapy: Secondary | ICD-10-CM | POA: Insufficient documentation

## 2015-12-17 DIAGNOSIS — Z791 Long term (current) use of non-steroidal anti-inflammatories (NSAID): Secondary | ICD-10-CM | POA: Diagnosis not present

## 2015-12-17 DIAGNOSIS — F329 Major depressive disorder, single episode, unspecified: Secondary | ICD-10-CM | POA: Insufficient documentation

## 2015-12-17 DIAGNOSIS — S6991XA Unspecified injury of right wrist, hand and finger(s), initial encounter: Secondary | ICD-10-CM | POA: Diagnosis present

## 2015-12-17 DIAGNOSIS — S62324A Displaced fracture of shaft of fourth metacarpal bone, right hand, initial encounter for closed fracture: Secondary | ICD-10-CM | POA: Insufficient documentation

## 2015-12-17 DIAGNOSIS — W01190A Fall on same level from slipping, tripping and stumbling with subsequent striking against furniture, initial encounter: Secondary | ICD-10-CM | POA: Insufficient documentation

## 2015-12-17 DIAGNOSIS — Z8619 Personal history of other infectious and parasitic diseases: Secondary | ICD-10-CM | POA: Insufficient documentation

## 2015-12-17 DIAGNOSIS — Y998 Other external cause status: Secondary | ICD-10-CM | POA: Diagnosis not present

## 2015-12-17 DIAGNOSIS — S62309A Unspecified fracture of unspecified metacarpal bone, initial encounter for closed fracture: Secondary | ICD-10-CM

## 2015-12-17 DIAGNOSIS — S62394A Other fracture of fourth metacarpal bone, right hand, initial encounter for closed fracture: Secondary | ICD-10-CM | POA: Diagnosis not present

## 2015-12-17 DIAGNOSIS — E079 Disorder of thyroid, unspecified: Secondary | ICD-10-CM | POA: Diagnosis not present

## 2015-12-17 DIAGNOSIS — S62321A Displaced fracture of shaft of second metacarpal bone, left hand, initial encounter for closed fracture: Secondary | ICD-10-CM | POA: Diagnosis not present

## 2015-12-17 DIAGNOSIS — F909 Attention-deficit hyperactivity disorder, unspecified type: Secondary | ICD-10-CM | POA: Diagnosis not present

## 2015-12-17 DIAGNOSIS — Y9389 Activity, other specified: Secondary | ICD-10-CM | POA: Insufficient documentation

## 2015-12-17 MED ORDER — HYDROCODONE-ACETAMINOPHEN 5-325 MG PO TABS: 1.0000 | ORAL_TABLET | ORAL | Status: DC | PRN

## 2015-12-17 NOTE — Progress Notes (Signed)
Orthopedic Tech Progress Note Patient Details:  Kathryn Castaneda August 09, 1961 [id number]  Ortho Devices Type of Ortho Device: Ace wrap, Ulna gutter splint Ortho Device/Splint Location: rue Ortho Device/Splint Interventions: Application   Yoshito Gaza 12/17/2015, 2:23 PM

## 2015-12-17 NOTE — ED Notes (Signed)
Pt reports she tripped over the cat and hit the RT hand on a door. Pt has swelling to RT hand.

## 2015-12-17 NOTE — ED Notes (Signed)
Declined W/C at D/C and was escorted to lobby by RN. 

## 2015-12-17 NOTE — ED Provider Notes (Signed)
CSN: [id number]     Arrival date & time 12/17/15  1128 History   First MD Initiated Contact with Patient 12/17/15 1134     No chief complaint on file.    (Consider location/radiation/quality/duration/timing/severity/associated sxs/prior Treatment) The history is provided by the patient.     Pt presents with pain and bruising to her right hand, specifically over the distal 4th metacarpal.  States she was getting ready for work at [employer] when she tripped over her cat and fell forward hitting her right hand on the door frame.  Has had pain and swelling that has been persistent since then, worse overnight, thinks she can feel the bones moving.  Pain is throbbing, worse with movement and palpation.   Is taking aleve and tylenol with good relief until last night. Denies weakness or numbness of the fingers.  Works at [employer] and [employer] in her job.  Pt is right handed.   Past Medical History  Diagnosis Date  . Hyperlipemia   . Thyroid disease   . Depression   . Acid reflux   . Hepatitis C   . ADHD (attention deficit hyperactivity disorder)    Past Surgical History  Procedure Laterality Date  . Joint replacement     No family history on file. Social History  Substance Use Topics  . Smoking status: Never Smoker   . Smokeless tobacco: Not on file  . Alcohol Use: Yes     Comment: occ   OB History    No data available     Review of Systems  Constitutional: Negative for fever.  Musculoskeletal: Positive for arthralgias. Negative for myalgias.  Skin: Positive for color change. Negative for wound.  Allergic/Immunologic: Negative for immunocompromised state.  Neurological: Negative for weakness and numbness.  Hematological: Does not bruise/bleed easily.  Psychiatric/Behavioral: Negative for self-injury (accidental ).      Allergies  Lactose intolerance (gi)  Home Medications   Prior to Admission medications   Medication Sig Start Date End Date Taking? Authorizing Provider    amphetamine-dextroamphetamine (ADDERALL XR) 25 MG 24 hr capsule Take 25 mg by mouth every morning.    Historical Provider, MD  clonazePAM (KLONOPIN) 1 MG tablet Take 1 mg by mouth daily as needed for anxiety (sleep).     Historical Provider, MD  levothyroxine (SYNTHROID, LEVOTHROID) 100 MCG tablet Take 100 mcg by mouth daily before breakfast.    Historical Provider, MD  meloxicam (MOBIC) 7.5 MG tablet Take 7.5 mg by mouth daily.    Historical Provider, MD  methocarbamol (ROBAXIN) 500 MG tablet Take 2 tablets (1,000 mg total) by mouth 2 (two) times daily. 06/16/15   Fayrene Helper, PA-C  Multiple Vitamin (MULTIVITAMIN WITH MINERALS) TABS tablet Take 1 tablet by mouth daily.    Historical Provider, MD  naproxen (NAPROSYN) 500 MG tablet Take 1 tablet (500 mg total) by mouth 2 (two) times daily with a meal. 03/02/14   Cristobal Goldmann, PA-C  omeprazole (PRILOSEC) 20 MG capsule Take 20 mg by mouth daily.    Historical Provider, MD  PARoxetine (PAXIL) 30 MG tablet Take 60 mg by mouth every morning.     Historical Provider, MD  propranolol (INDERAL) 20 MG tablet Take 20 mg by mouth 2 (two) times daily.     Historical Provider, MD  sulfaSALAzine (AZULFIDINE) 500 MG tablet Take 500 mg by mouth 2 (two) times daily.    Historical Provider, MD  SUMAtriptan (IMITREX) 100 MG tablet Take 100 mg by mouth every 2 (two) hours  as needed for migraine.    Historical Provider, MD  traMADol (ULTRAM) 50 MG tablet Take 1 tablet (50 mg total) by mouth every 6 (six) hours as needed. 06/16/15   Fayrene Helper, PA-C   There were no vitals taken for this visit. Physical Exam  Constitutional: She appears well-developed and well-nourished. No distress.  HENT:  Head: Normocephalic and atraumatic.  Neck: Neck supple.  Pulmonary/Chest: Effort normal.  Musculoskeletal:       Right wrist: Normal.       Right forearm: Normal.       Hands: Right hand with light ecchymosis over base of 4th finger and overlying distal 4th metacarpal.  Tender to  palpation. Full active range of motion of all digits, strength 5/5, sensation intact, capillary refill < 2 seconds.    Neurological: She is alert.  Skin: She is not diaphoretic.  Nursing note and vitals reviewed.   ED Course  Procedures (including critical care time) Labs Review Labs Reviewed - No data to display  Imaging Review Dg Hand Complete Right  12/17/2015  CLINICAL DATA:  Pain swelling and bruising of the right hand, status post injury. EXAM: RIGHT HAND - COMPLETE 3+ VIEW COMPARISON:  08/03/2015 FINDINGS: There is an oblique fracture through the mid to distal shaft of the fourth metacarpal bone, with minimal impaction and posterior displacement of the distal fracture fragment. There is an old healed fracture causing deformity of the fifth metacarpal head. No other fractures are seen. There is an associated soft tissue swelling. IMPRESSION: Oblique minimally displaced acute fracture through the mid to distal right fourth metacarpal shaft. Electronically Signed   By: Ted Mcalpine M.D.   On: 12/17/2015 13:13   I have personally reviewed and evaluated these images and lab results as part of my medical decision-making.   EKG Interpretation None      MDM   Final diagnoses:  Metacarpal bone fracture, closed, initial encounter    Afebrile, nontoxic patient with injury to her right hand while falling (mechanical fall) against a door.  Neurovascularly intact.   Xray demonstrated 4th metacarpal fracture.  Pt declines work note, requests pain medication for night time.  D/C home with ulnar gutter splint, hand surgeon follow up (pt has seen hand surgeon before, she is unsure of name).  Discussed result, findings, treatment, and follow up  with patient.  Pt given return precautions.  Pt verbalizes understanding and agrees with plan.       Trixie Dredge, PA-C 12/17/15 1500  Arby Barrette, MD 12/19/15 [phone]

## 2015-12-17 NOTE — ED Notes (Signed)
SEE PA assessment 

## 2015-12-17 NOTE — Discharge Instructions (Signed)
Read the information below.  Use the prescribed medication as directed.  Please discuss all new medications with your pharmacist.  Do not take additional tylenol while taking the prescribed pain medication to avoid overdose.  Do not drive or operate heavy machinery while taking prescribed pain medication.  You may return to the Emergency Department at any time for worsening condition or any new symptoms that concern you.  If you develop uncontrolled pain, weakness or numbness of the extremity, severe discoloration of the skin, or you are unable to wiggle your fingers, return to the ER for a recheck.      Cast or Splint Care Casts and splints support injured limbs and keep bones from moving while they heal. It is important to care for your cast or splint at home.  HOME CARE INSTRUCTIONS  Keep the cast or splint uncovered during the drying period. It can take 24 to 48 hours to dry if it is made of plaster. A fiberglass cast will dry in less than 1 hour.  Do not rest the cast on anything harder than a pillow for the first 24 hours.  Do not put weight on your injured limb or apply pressure to the cast until your health care provider gives you permission.  Keep the cast or splint dry. Wet casts or splints can lose their shape and may not support the limb as well. A wet cast that has lost its shape can also create harmful pressure on your skin when it dries. Also, wet skin can become infected.  Cover the cast or splint with a plastic bag when bathing or when out in the rain or snow. If the cast is on the trunk of the body, take sponge baths until the cast is removed.  If your cast does become wet, dry it with a towel or a blow dryer on the cool setting only.  Keep your cast or splint clean. Soiled casts may be wiped with a moistened cloth.  Do not place any hard or soft foreign objects under your cast or splint, such as cotton, toilet paper, lotion, or powder.  Do not try to scratch the skin under  the cast with any object. The object could get stuck inside the cast. Also, scratching could lead to an infection. If itching is a problem, use a blow dryer on a cool setting to relieve discomfort.  Do not trim or cut your cast or remove padding from inside of it.  Exercise all joints next to the injury that are not immobilized by the cast or splint. For example, if you have a long leg cast, exercise the hip joint and toes. If you have an arm cast or splint, exercise the shoulder, elbow, thumb, and fingers.  Elevate your injured arm or leg on 1 or 2 pillows for the first 1 to 3 days to decrease swelling and pain.It is best if you can comfortably elevate your cast so it is higher than your heart. SEEK MEDICAL CARE IF:   Your cast or splint cracks.  Your cast or splint is too tight or too loose.  You have unbearable itching inside the cast.  Your cast becomes wet or develops a soft spot or area.  You have a bad smell coming from inside your cast.  You get an object stuck under your cast.  Your skin around the cast becomes red or raw.  You have new pain or worsening pain after the cast has been applied. SEEK IMMEDIATE MEDICAL CARE  IF:   You have fluid leaking through the cast.  You are unable to move your fingers or toes.  You have discolored (blue or white), cool, painful, or very swollen fingers or toes beyond the cast.  You have tingling or numbness around the injured area.  You have severe pain or pressure under the cast.  You have any difficulty with your breathing or have shortness of breath.  You have chest pain.   This information is not intended to replace advice given to you by your health care provider. Make sure you discuss any questions you have with your health care provider.   Document Released: 11/22/2000 Document Revised: 09/15/2013 Document Reviewed: 06/03/2013 Elsevier Interactive Patient Education 2016 [employer]  Metacarpal Fracture A metacarpal  fracture is a break (fracture) of a bone in the hand. Metacarpals are the bones that extend from your knuckles to your wrist. In each hand, you have five metacarpal bones that connect your fingers and your thumb to your wrist. Some hand fractures have bone pieces that are close together and stable (simple). These fractures may be treated with only a splint or cast. Hand fractures that have many pieces of broken bone (comminuted), unstable bone pieces (displaced), or a bone that breaks through the skin (compound) usually require surgery. CAUSES This injury may be caused by:  A fall.  A hard, direct hit to your hand.  An injury that squeezes your knuckle, stretches your finger out of place, or crushes your hand. RISK FACTORS This injury is more likely to occur if:  You play contact sports.  You have certain bone diseases. SYMPTOMS  Symptoms of this type of fracture develop soon after the injury. Symptoms may include:  Swelling.  Pain.  Stiffness.  Increased pain with movement.  Bruising.  Inability to move a finger.  A shortened finger.  A finger knuckle that looks sunken in.  Unusual appearance of the hand or finger (deformity). DIAGNOSIS  This injury may be diagnosed based on your signs and symptoms, especially if you had a recent hand injury. Your health care provider will perform a physical exam. He or she may also order X-rays to confirm the diagnosis.  TREATMENT  Treatment for this injury depends on the type of fracture you have and how severe it is. Possible treatments include:  Non-reduction. This can be done if the bone does not need to be moved back into place. The fracture can be casted or splinted as it is.   Closed reduction. If your bone is stable and can be moved back into place, you may only need to wear a cast or splint or have buddy taping.  Closed reduction with internal fixation (CRIF). This is the most common treatment. You may have this procedure if  your bone can be moved back into place but needs more support. Wires, pins, or screws may be inserted through your skin to stabilize the fracture.  Open reduction with internal fixation (ORIF). This may be needed if your fracture is severe and unstable. It involves surgery to move your bone back into the right position. Screws, wires, or plates are used to stabilize the fracture. After all procedures, you may need to wear a cast or a splint for several weeks. You will also need to have follow-up X-rays to make sure that the bone is healing well and staying in position. After you no longer need your cast or splint, you may need physical therapy. This will help you to regain full  movement and strength in your hand.  HOME CARE INSTRUCTIONS  If You Have a Cast:  Do not stick anything inside the cast to scratch your skin. Doing that increases your risk of infection.  Check the skin around the cast every day. Report any concerns to your health care provider. You may put lotion on dry skin around the edges of the cast. Do not apply lotion to the skin underneath the cast. If You Have a Splint:  Wear it as directed by your health care provider. Remove it only as directed by your health care provider.  Loosen the splint if your fingers become numb and tingle, or if they turn cold and blue. Bathing  Cover the cast or splint with a watertight plastic bag to protect it from water while you take a bath or a shower. Do not let the cast or splint get wet. Managing Pain, Stiffness, and Swelling  If directed, apply ice to the injured area (if you have a splint, not a cast):  Put ice in a plastic bag.  Place a towel between your skin and the bag.  Leave the ice on for 20 minutes, 2-3 times a day.  Move your fingers often to avoid stiffness and to lessen swelling.  Raise the injured area above the level of your heart while you are sitting or lying down. Driving  Do not drive or operate heavy machinery  while taking pain medicine.  Do not drive while wearing a cast or splint on a hand that you use for driving. Activity  Return to your normal activities as directed by your health care provider. Ask your health care provider what activities are safe for you. General Instructions  Do not put pressure on any part of the cast or splint until it is fully hardened. This may take several hours.  Keep the cast or splint clean and dry.  Do not use any tobacco products, including cigarettes, chewing tobacco, or electronic cigarettes. Tobacco can delay bone healing. If you need help quitting, ask your health care provider.  Take medicines only as directed by your health care provider.  Keep all follow-up visits as directed by your health care provider. This is important. SEEK MEDICAL CARE IF:   Your pain is getting worse.  You have redness, swelling, or pain in the injured area.   You have fluid, blood, or pus coming from under your cast or splint.   You notice a bad smell coming from under your cast or splint.   You have a fever.  SEEK IMMEDIATE MEDICAL CARE IF:   You develop a rash.   You have trouble breathing.   Your skin or nails on your injured hand turn blue or gray even after you loosen your splint.  Your injured hand feels cold or becomes numb even after you loosen your splint.   You develop severe pain under the cast or in your hand.   This information is not intended to replace advice given to you by your health care provider. Make sure you discuss any questions you have with your health care provider.   Document Released: 11/25/2005 Document Revised: 08/16/2015 Document Reviewed: 09/14/2014 Elsevier Interactive Patient Education [employer]

## 2015-12-20 DIAGNOSIS — S62324A Displaced fracture of shaft of fourth metacarpal bone, right hand, initial encounter for closed fracture: Secondary | ICD-10-CM | POA: Diagnosis not present

## 2015-12-22 MED FILL — HYDROCODON-APAP 5-325: 5-325 | 4 days supply | Qty: 40 | Fill #0

## 2015-12-28 MED FILL — PARoxetine HCL 30 MG TABS: 30 | 90 days supply | Qty: 90 | Fill #1

## 2016-01-02 MED FILL — HUMIRA PEN 40 MG/0.8ML PNKT: 40 | 28 days supply | Qty: 2 | Fill #0

## 2016-01-08 MED FILL — SYNTHROID 100 MCG TABLET: 100 | 90 days supply | Qty: 90 | Fill #0

## 2016-01-08 MED FILL — LATUDA 60 MG TABLET: 60 | 30 days supply | Qty: 30 | Fill #0

## 2016-01-12 MED FILL — DEXTROAMP-AMPHET ER 20 MG C: 20 | 30 days supply | Qty: 30 | Fill #0

## 2016-01-12 MED FILL — AMOXICILLIN 500 MG CAPSULE: 500 | 2 days supply | Qty: 8 | Fill #0

## 2016-01-15 MED FILL — clonazePAM 1 MG TABS: 1 | 30 days supply | Qty: 30 | Fill #1

## 2016-01-15 MED FILL — OMEPRAZOLE DR 20 MG CAPSULE: 20 | 90 days supply | Qty: 90 | Fill #1

## 2016-01-30 DIAGNOSIS — M25512 Pain in left shoulder: Secondary | ICD-10-CM | POA: Diagnosis not present

## 2016-01-31 MED FILL — METHOCARBAMOL 500 MG TABLET: 500 | 15 days supply | Qty: 30 | Fill #0

## 2016-02-07 DIAGNOSIS — F39 Unspecified mood [affective] disorder: Secondary | ICD-10-CM | POA: Diagnosis not present

## 2016-02-07 DIAGNOSIS — F902 Attention-deficit hyperactivity disorder, combined type: Secondary | ICD-10-CM | POA: Diagnosis not present

## 2016-02-07 DIAGNOSIS — M67912 Unspecified disorder of synovium and tendon, left shoulder: Secondary | ICD-10-CM | POA: Diagnosis not present

## 2016-02-07 MED FILL — LATUDA 60 MG TABLET: 60 | 30 days supply | Qty: 30 | Fill #0

## 2016-02-12 MED FILL — DEXTROAMP-AMPHET ER 20 MG C: 20 | 30 days supply | Qty: 30 | Fill #0

## 2016-02-12 MED FILL — clonazePAM 1 MG TABS: 1 | 30 days supply | Qty: 30 | Fill #0

## 2016-02-12 MED FILL — HUMIRA PEN 40 MG/0.8ML PNKT: 40 | 28 days supply | Qty: 2 | Fill #1

## 2016-02-13 DIAGNOSIS — M25512 Pain in left shoulder: Secondary | ICD-10-CM | POA: Diagnosis not present

## 2016-02-14 ENCOUNTER — Other Ambulatory Visit: Payer: Self-pay | Admitting: Orthopedic Surgery

## 2016-02-14 ENCOUNTER — Encounter (HOSPITAL_BASED_OUTPATIENT_CLINIC_OR_DEPARTMENT_OTHER): Payer: Self-pay | Admitting: *Deleted

## 2016-02-14 DIAGNOSIS — M75112 Incomplete rotator cuff tear or rupture of left shoulder, not specified as traumatic: Secondary | ICD-10-CM | POA: Diagnosis not present

## 2016-02-19 ENCOUNTER — Other Ambulatory Visit: Payer: Self-pay | Admitting: Orthopedic Surgery

## 2016-02-20 ENCOUNTER — Ambulatory Visit (HOSPITAL_BASED_OUTPATIENT_CLINIC_OR_DEPARTMENT_OTHER)
Admission: RE | Admit: 2016-02-20 | Discharge: 2016-02-20 | Disposition: A | Discharge status: Home / Self Care | Payer: 59 | Source: Ambulatory Visit | Attending: Orthopedic Surgery | Admitting: Orthopedic Surgery

## 2016-02-20 ENCOUNTER — Ambulatory Visit (HOSPITAL_BASED_OUTPATIENT_CLINIC_OR_DEPARTMENT_OTHER): Payer: 59 | Admitting: Anesthesiology

## 2016-02-20 ENCOUNTER — Encounter (HOSPITAL_BASED_OUTPATIENT_CLINIC_OR_DEPARTMENT_OTHER)
Admission: RE | Disposition: A | Discharge status: Home / Self Care | Payer: Self-pay | Source: Ambulatory Visit | Attending: Orthopedic Surgery

## 2016-02-20 ENCOUNTER — Encounter (HOSPITAL_BASED_OUTPATIENT_CLINIC_OR_DEPARTMENT_OTHER): Payer: Self-pay | Admitting: Anesthesiology

## 2016-02-20 DIAGNOSIS — F329 Major depressive disorder, single episode, unspecified: Secondary | ICD-10-CM | POA: Diagnosis not present

## 2016-02-20 DIAGNOSIS — S4382XA Sprain of other specified parts of left shoulder girdle, initial encounter: Secondary | ICD-10-CM | POA: Diagnosis not present

## 2016-02-20 DIAGNOSIS — S46212A Strain of muscle, fascia and tendon of other parts of biceps, left arm, initial encounter: Secondary | ICD-10-CM | POA: Diagnosis not present

## 2016-02-20 DIAGNOSIS — F909 Attention-deficit hyperactivity disorder, unspecified type: Secondary | ICD-10-CM | POA: Insufficient documentation

## 2016-02-20 DIAGNOSIS — S46012A Strain of muscle(s) and tendon(s) of the rotator cuff of left shoulder, initial encounter: Secondary | ICD-10-CM | POA: Insufficient documentation

## 2016-02-20 DIAGNOSIS — F419 Anxiety disorder, unspecified: Secondary | ICD-10-CM | POA: Insufficient documentation

## 2016-02-20 DIAGNOSIS — Z791 Long term (current) use of non-steroidal anti-inflammatories (NSAID): Secondary | ICD-10-CM | POA: Insufficient documentation

## 2016-02-20 DIAGNOSIS — S46192A Other injury of muscle, fascia and tendon of long head of biceps, left arm, initial encounter: Secondary | ICD-10-CM | POA: Diagnosis not present

## 2016-02-20 DIAGNOSIS — Z79899 Other long term (current) drug therapy: Secondary | ICD-10-CM | POA: Diagnosis not present

## 2016-02-20 DIAGNOSIS — E785 Hyperlipidemia, unspecified: Secondary | ICD-10-CM | POA: Diagnosis not present

## 2016-02-20 DIAGNOSIS — E039 Hypothyroidism, unspecified: Secondary | ICD-10-CM | POA: Insufficient documentation

## 2016-02-20 DIAGNOSIS — K219 Gastro-esophageal reflux disease without esophagitis: Secondary | ICD-10-CM | POA: Insufficient documentation

## 2016-02-20 DIAGNOSIS — M25512 Pain in left shoulder: Secondary | ICD-10-CM | POA: Diagnosis not present

## 2016-02-20 DIAGNOSIS — M1990 Unspecified osteoarthritis, unspecified site: Secondary | ICD-10-CM | POA: Insufficient documentation

## 2016-02-20 DIAGNOSIS — G8918 Other acute postprocedural pain: Secondary | ICD-10-CM | POA: Diagnosis not present

## 2016-02-20 HISTORY — DX: Hypothyroidism, unspecified: E03.9

## 2016-02-20 HISTORY — PX: SHOULDER ARTHROSCOPY WITH BICEPSTENOTOMY: SHX6204

## 2016-02-20 HISTORY — DX: Anxiety disorder, unspecified: F41.9

## 2016-02-20 HISTORY — DX: Headache: R51

## 2016-02-20 HISTORY — DX: Headache, unspecified: R51.9

## 2016-02-20 HISTORY — DX: Unspecified osteoarthritis, unspecified site: M19.90

## 2016-02-20 SURGERY — SHOULDER ARTHROSCOPY WITH BICEPS TENOTOMY
Anesthesia: General | Site: Shoulder | Laterality: Left

## 2016-02-20 MED ORDER — DOCUSATE SODIUM 100 MG PO CAPS: 100.0000 mg | ORAL_CAPSULE | Freq: Three times a day (TID) | ORAL | Status: DC | PRN

## 2016-02-20 MED ORDER — SUCCINYLCHOLINE CHLORIDE 20 MG/ML IJ SOLN
INTRAMUSCULAR | Status: DC | PRN
  Administered 2016-02-20: 50 mg via INTRAVENOUS

## 2016-02-20 MED ORDER — MIDAZOLAM HCL 2 MG/2ML IJ SOLN
INTRAMUSCULAR | Status: AC
  Filled 2016-02-20: qty 2

## 2016-02-20 MED ORDER — SODIUM CHLORIDE 0.9 % IR SOLN
Status: DC | PRN
  Administered 2016-02-20: 6000 mL

## 2016-02-20 MED ORDER — MIDAZOLAM HCL 2 MG/2ML IJ SOLN
1.0000 mg | INTRAMUSCULAR | Status: DC | PRN
  Administered 2016-02-20: 2 mg via INTRAVENOUS

## 2016-02-20 MED ORDER — EPHEDRINE SULFATE 50 MG/ML IJ SOLN
INTRAMUSCULAR | Status: DC | PRN
  Administered 2016-02-20: 10 mg via INTRAVENOUS

## 2016-02-20 MED ORDER — LACTATED RINGERS IV SOLN
INTRAVENOUS | Status: DC
  Administered 2016-02-20 (×2): via INTRAVENOUS

## 2016-02-20 MED ORDER — MEPERIDINE HCL 25 MG/ML IJ SOLN: 6.2500 mg | INTRAMUSCULAR | Status: DC | PRN

## 2016-02-20 MED ORDER — SUCCINYLCHOLINE CHLORIDE 20 MG/ML IJ SOLN
INTRAMUSCULAR | Status: AC
  Filled 2016-02-20: qty 1

## 2016-02-20 MED ORDER — FENTANYL CITRATE (PF) 100 MCG/2ML IJ SOLN
50.0000 ug | INTRAMUSCULAR | Status: DC | PRN
  Administered 2016-02-20: 100 ug via INTRAVENOUS

## 2016-02-20 MED ORDER — OXYCODONE HCL 5 MG/5ML PO SOLN: 5.0000 mg | Freq: Once | ORAL | Status: DC | PRN

## 2016-02-20 MED ORDER — OXYCODONE HCL 5 MG PO TABS: 5.0000 mg | ORAL_TABLET | Freq: Once | ORAL | Status: DC | PRN

## 2016-02-20 MED ORDER — FENTANYL CITRATE (PF) 100 MCG/2ML IJ SOLN
INTRAMUSCULAR | Status: DC | PRN
  Administered 2016-02-20: 50 ug via INTRAVENOUS
  Administered 2016-02-20: 100 ug via INTRAVENOUS
  Administered 2016-02-20 (×2): 25 ug via INTRAVENOUS

## 2016-02-20 MED ORDER — ONDANSETRON HCL 4 MG/2ML IJ SOLN
INTRAMUSCULAR | Status: AC
  Filled 2016-02-20: qty 2

## 2016-02-20 MED ORDER — DEXAMETHASONE SODIUM PHOSPHATE 10 MG/ML IJ SOLN
INTRAMUSCULAR | Status: AC
  Filled 2016-02-20: qty 1

## 2016-02-20 MED ORDER — DEXAMETHASONE SODIUM PHOSPHATE 4 MG/ML IJ SOLN
INTRAMUSCULAR | Status: DC | PRN
  Administered 2016-02-20: 10 mg via INTRAVENOUS

## 2016-02-20 MED ORDER — METOPROLOL TARTRATE 1 MG/ML IV SOLN
INTRAVENOUS | Status: DC | PRN
  Administered 2016-02-20: 2.5 mg via INTRAVENOUS

## 2016-02-20 MED ORDER — ONDANSETRON HCL 4 MG/2ML IJ SOLN
INTRAMUSCULAR | Status: DC | PRN
  Administered 2016-02-20: 4 mg via INTRAVENOUS

## 2016-02-20 MED ORDER — METOPROLOL TARTRATE 1 MG/ML IV SOLN
INTRAVENOUS | Status: AC
  Filled 2016-02-20: qty 5

## 2016-02-20 MED ORDER — HYDROMORPHONE HCL 1 MG/ML IJ SOLN: 0.2500 mg | INTRAMUSCULAR | Status: DC | PRN

## 2016-02-20 MED ORDER — CEFAZOLIN SODIUM-DEXTROSE 2-3 GM-% IV SOLR
2.0000 g | INTRAVENOUS | Status: AC
  Administered 2016-02-20: 2 g via INTRAVENOUS

## 2016-02-20 MED ORDER — FENTANYL CITRATE (PF) 100 MCG/2ML IJ SOLN
INTRAMUSCULAR | Status: AC
  Filled 2016-02-20: qty 2

## 2016-02-20 MED ORDER — GLYCOPYRROLATE 0.2 MG/ML IJ SOLN: 0.2000 mg | Freq: Once | INTRAMUSCULAR | Status: DC | PRN

## 2016-02-20 MED ORDER — OXYCODONE-ACETAMINOPHEN 5-325 MG PO TABS: 1.0000 | ORAL_TABLET | ORAL | Status: DC | PRN

## 2016-02-20 MED ORDER — GLYCOPYRROLATE 0.2 MG/ML IJ SOLN
INTRAMUSCULAR | Status: DC | PRN
  Administered 2016-02-20 (×2): 0.2 mg via INTRAVENOUS

## 2016-02-20 MED ORDER — PROPOFOL 10 MG/ML IV BOLUS
INTRAVENOUS | Status: DC | PRN
  Administered 2016-02-20 (×2): 50 mg via INTRAVENOUS
  Administered 2016-02-20 (×2): 30 mg via INTRAVENOUS
  Administered 2016-02-20: 50 mg via INTRAVENOUS
  Administered 2016-02-20: 30 mg via INTRAVENOUS
  Administered 2016-02-20: 50 mg via INTRAVENOUS
  Administered 2016-02-20: 200 mg via INTRAVENOUS

## 2016-02-20 MED ORDER — POVIDONE-IODINE 7.5 % EX SOLN: Freq: Once | CUTANEOUS | Status: DC

## 2016-02-20 MED ORDER — SCOPOLAMINE 1 MG/3DAYS TD PT72: 1.0000 | MEDICATED_PATCH | Freq: Once | TRANSDERMAL | Status: DC | PRN

## 2016-02-20 MED ORDER — BUPIVACAINE-EPINEPHRINE (PF) 0.5% -1:200000 IJ SOLN
INTRAMUSCULAR | Status: DC | PRN
  Administered 2016-02-20: 25 mL via PERINEURAL

## 2016-02-20 MED ORDER — PROPOFOL 10 MG/ML IV BOLUS
INTRAVENOUS | Status: AC
  Filled 2016-02-20: qty 20

## 2016-02-20 MED ORDER — LIDOCAINE HCL (CARDIAC) 20 MG/ML IV SOLN
INTRAVENOUS | Status: AC
  Filled 2016-02-20: qty 5

## 2016-02-20 MED ORDER — MIDAZOLAM HCL 5 MG/5ML IJ SOLN
INTRAMUSCULAR | Status: DC | PRN
  Administered 2016-02-20: 2 mg via INTRAVENOUS

## 2016-02-20 MED ORDER — LIDOCAINE HCL (CARDIAC) 20 MG/ML IV SOLN
INTRAVENOUS | Status: DC | PRN
  Administered 2016-02-20: 50 mg via INTRAVENOUS

## 2016-02-20 MED ORDER — CEFAZOLIN SODIUM-DEXTROSE 2-3 GM-% IV SOLR
INTRAVENOUS | Status: AC
  Filled 2016-02-20: qty 50

## 2016-02-20 MED FILL — OXYCODONE/APAP 5-325: 5-325 | 5 days supply | Qty: 60 | Fill #0

## 2016-02-20 SURGICAL SUPPLY — 83 items
ANCHOR PEEK 4.75X19.1 SWLK C (Anchor) ×3 IMPLANT
BENZOIN TINCTURE PRP APPL 2/3 (GAUZE/BANDAGES/DRESSINGS) IMPLANT
BIT DRILL 4.8MMDIAX5IN DISPOSE (BIT) ×1 IMPLANT
BIT DRILL 7/64X5 DISP (BIT) ×3 IMPLANT
BLADE SURG 15 STRL LF DISP TIS (BLADE) ×1 IMPLANT
BLADE SURG 15 STRL SS (BLADE) ×2
BUR OVAL 4.0 (BURR) ×3 IMPLANT
CANNULA 5.75X71 LONG (CANNULA) ×3 IMPLANT
CANNULA TWIST IN 8.25X7CM (CANNULA) ×3 IMPLANT
CHLORAPREP W/TINT 26ML (MISCELLANEOUS) ×3 IMPLANT
CLOSURE WOUND 1/2 X4 (GAUZE/BANDAGES/DRESSINGS)
DRAPE IMP U-DRAPE 54X76 (DRAPES) ×3 IMPLANT
DRAPE INCISE IOBAN 66X45 STRL (DRAPES) ×3 IMPLANT
DRAPE STERI 35X30 U-POUCH (DRAPES) ×3 IMPLANT
DRAPE SURG 17X23 STRL (DRAPES) ×3 IMPLANT
DRAPE U-SHAPE 47X51 STRL (DRAPES) ×3 IMPLANT
DRAPE U-SHAPE 76X120 STRL (DRAPES) ×6 IMPLANT
DRILL BIT 4.8MMDIAX5IN DISPOSE (BIT) ×3
DRSG PAD ABDOMINAL 8X10 ST (GAUZE/BANDAGES/DRESSINGS) ×3 IMPLANT
ELECT REM PT RETURN 9FT ADLT (ELECTROSURGICAL) ×3
ELECTRODE REM PT RTRN 9FT ADLT (ELECTROSURGICAL) ×1 IMPLANT
GAUZE SPONGE 4X4 12PLY STRL (GAUZE/BANDAGES/DRESSINGS) ×3 IMPLANT
GAUZE SPONGE 4X4 16PLY XRAY LF (GAUZE/BANDAGES/DRESSINGS) IMPLANT
GAUZE XEROFORM 1X8 LF (GAUZE/BANDAGES/DRESSINGS) ×3 IMPLANT
GLOVE BIO SURGEON STRL SZ7 (GLOVE) ×3 IMPLANT
GLOVE BIO SURGEON STRL SZ7.5 (GLOVE) ×3 IMPLANT
GLOVE BIOGEL PI IND STRL 7.0 (GLOVE) ×2 IMPLANT
GLOVE BIOGEL PI IND STRL 8 (GLOVE) ×1 IMPLANT
GLOVE BIOGEL PI INDICATOR 7.0 (GLOVE) ×4
GLOVE BIOGEL PI INDICATOR 8 (GLOVE) ×2
GLOVE ECLIPSE 6.5 STRL STRAW (GLOVE) ×3 IMPLANT
GOWN STRL REUS W/ TWL LRG LVL3 (GOWN DISPOSABLE) ×2 IMPLANT
GOWN STRL REUS W/ TWL XL LVL3 (GOWN DISPOSABLE) ×1 IMPLANT
GOWN STRL REUS W/TWL LRG LVL3 (GOWN DISPOSABLE) ×4
GOWN STRL REUS W/TWL XL LVL3 (GOWN DISPOSABLE) ×2
IV NS IRRIG 3000ML ARTHROMATIC (IV SOLUTION) ×6 IMPLANT
LASSO CRESCENT QUICKPASS (SUTURE) ×3 IMPLANT
LIQUID BAND (GAUZE/BANDAGES/DRESSINGS) ×3 IMPLANT
MANIFOLD NEPTUNE II (INSTRUMENTS) ×3 IMPLANT
NDL SUT 6 .5 CRC .975X.05 MAYO (NEEDLE) IMPLANT
NEEDLE 1/2 CIR CATGUT .05X1.09 (NEEDLE) IMPLANT
NEEDLE MAYO TAPER (NEEDLE)
NEEDLE SCORPION MULTI FIRE (NEEDLE) IMPLANT
NS IRRIG 1000ML POUR BTL (IV SOLUTION) IMPLANT
PACK ARTHROSCOPY DSU (CUSTOM PROCEDURE TRAY) ×3 IMPLANT
PACK BASIN DAY SURGERY FS (CUSTOM PROCEDURE TRAY) ×3 IMPLANT
PENCIL BUTTON HOLSTER BLD 10FT (ELECTRODE) ×3 IMPLANT
RESECTOR FULL RADIUS 4.2MM (BLADE) ×3 IMPLANT
SLEEVE SCD COMPRESS KNEE MED (MISCELLANEOUS) ×3 IMPLANT
SLING ARM FOAM STRAP LRG (SOFTGOODS) IMPLANT
SLING ARM IMMOBILIZER MED (SOFTGOODS) IMPLANT
SLING ARM MED ADULT FOAM STRAP (SOFTGOODS) IMPLANT
SLING ARM XL FOAM STRAP (SOFTGOODS) IMPLANT
SPONGE LAP 4X18 X RAY DECT (DISPOSABLE) ×3 IMPLANT
STRIP CLOSURE SKIN 1/2X4 (GAUZE/BANDAGES/DRESSINGS) IMPLANT
SUCTION FRAZIER HANDLE 10FR (MISCELLANEOUS)
SUCTION TUBE FRAZIER 10FR DISP (MISCELLANEOUS) IMPLANT
SUPPORT WRAP ARM LG (MISCELLANEOUS) ×3 IMPLANT
SUT 2 FIBERLOOP 20 STRT BLUE (SUTURE) ×3
SUT BONE WAX W31G (SUTURE) IMPLANT
SUT ETHILON 3 0 PS 1 (SUTURE) ×3 IMPLANT
SUT FIBERWIRE #2 38 T-5 BLUE (SUTURE)
SUT MNCRL AB 3-0 PS2 18 (SUTURE) IMPLANT
SUT MNCRL AB 4-0 PS2 18 (SUTURE) IMPLANT
SUT PDS AB 0 CT 36 (SUTURE) IMPLANT
SUT PROLENE 3 0 PS 2 (SUTURE) IMPLANT
SUT TIGER TAPE 7 IN WHITE (SUTURE) ×3 IMPLANT
SUT VIC AB 0 CT1 27 (SUTURE)
SUT VIC AB 0 CT1 27XBRD ANBCTR (SUTURE) IMPLANT
SUT VIC AB 2-0 SH 27 (SUTURE)
SUT VIC AB 2-0 SH 27XBRD (SUTURE) IMPLANT
SUTURE 2 FIBERLOOP 20 STRT BLU (SUTURE) ×1 IMPLANT
SUTURE FIBERWR #2 38 T-5 BLUE (SUTURE) IMPLANT
SYR BULB 3OZ (MISCELLANEOUS) ×3 IMPLANT
TAPE FIBER 2MM 7IN #2 BLUE (SUTURE) IMPLANT
TOWEL OR 17X24 6PK STRL BLUE (TOWEL DISPOSABLE) ×3 IMPLANT
TOWEL OR NON WOVEN STRL DISP B (DISPOSABLE) ×3 IMPLANT
TUBE CONNECTING 20'X1/4 (TUBING) ×1
TUBE CONNECTING 20X1/4 (TUBING) ×2 IMPLANT
TUBING ARTHROSCOPY IRRIG 16FT (MISCELLANEOUS) ×3 IMPLANT
WAND STAR VAC 90 (SURGICAL WAND) ×3 IMPLANT
WATER STERILE IRR 1000ML POUR (IV SOLUTION) ×3 IMPLANT
YANKAUER SUCT BULB TIP NO VENT (SUCTIONS) IMPLANT

## 2016-02-20 NOTE — Anesthesia Preprocedure Evaluation (Signed)
Anesthesia Evaluation  Patient identified by MRN, date of birth, ID band Patient awake    Reviewed: Allergy & Precautions, NPO status , Patient's Chart, lab work & pertinent test results  Airway Mallampati: I  TM Distance: >3 FB Neck ROM: Full    Dental  (+) Teeth Intact, Dental Advisory Given   Pulmonary    breath sounds clear to auscultation       Cardiovascular  Rhythm:Regular Rate:Normal     Neuro/Psych    GI/Hepatic GERD  Medicated and Controlled,(+) Hepatitis -, C  Endo/Other  Hypothyroidism   Renal/GU      Musculoskeletal   Abdominal   Peds  Hematology   Anesthesia Other Findings   Reproductive/Obstetrics                             Anesthesia Physical Anesthesia Plan  ASA: II  Anesthesia Plan: General   Post-op Pain Management: MAC Combined w/ Regional for Post-op pain   Induction: Intravenous  Airway Management Planned: Oral ETT  Additional Equipment:   Intra-op Plan:   Post-operative Plan: Extubation in OR  Informed Consent: I have reviewed the patients History and Physical, chart, labs and discussed the procedure including the risks, benefits and alternatives for the proposed anesthesia with the patient or authorized representative who has indicated his/her understanding and acceptance.   Dental advisory given  Plan Discussed with: CRNA, Anesthesiologist and Surgeon  Anesthesia Plan Comments:         Anesthesia Quick Evaluation

## 2016-02-20 NOTE — Op Note (Signed)
Procedure(s):   Kathryn Castaneda female 55 y.o. 02/20/2016  Procedure(s) and Anesthesia Type: #1 arthroscopic left rotator cuff repair, subscapularis #2 arthroscopic left biceps tenotomy and debridement partial thickness supraspinatus tear #3 open left subpectoral biceps tenodesis  Surgeon(s) and Role:    * Jones Broom, MD - Primary     Surgeon: Mable Paris   Assistants: Damita Lack PA-C (Danielle was present and scrubbed throughout the procedure and was essential in positioning, retraction, exposure, and closure)  Anesthesia: General endotracheal anesthesia with preoperative interscalene block given by the attending anesthesiologist    Procedure Detail    Estimated Blood Loss: Min         Drains: none  Blood Given: none         Specimens: none        Complications:  * No complications entered in OR log *         Disposition: PACU - hemodynamically stable.         Condition: stable    Procedure:   INDICATIONS FOR SURGERY: The patient is 55 y.o. female who had an injury falling off a horse and has had left shoulder pain that time. MRI revealed subscapular tear with dislocation of the biceps tendon. She was indicated for surgical treatment to decrease pain and restore function. She understood risks benefits and alternatives to the procedure and wished to go forward with surgery.  OPERATIVE FINDINGS: Examination under anesthesia: No stiffness or instability  DESCRIPTION OF PROCEDURE: The patient was identified in preoperative  holding area where I personally marked the operative site after  verifying site, side, and procedure with the patient. An interscalene block was given by the attending anesthesiologist the holding area.  The patient was taken back to the operating room where general anesthesia was induced without complication and was placed in the beach-chair position with the back  elevated about 60 degrees and all extremities and head  and neck carefully padded and  positioned.   The left upper extremity was then prepped and  draped in a standard sterile fashion. The appropriate time-out  procedure was carried out. The patient did receive IV antibiotics  within 30 minutes of incision.   A small posterior portal incision was made and the arthroscope was introduced into the joint. An anterior portal was then established above the subscapularis using needle localization. Small cannula was placed anteriorly. Diagnostic arthroscopy was then carried out.  The upper border of the subscapularis was noted to be torn and the biceps tendon was dislocated into the tear. The biceps tendon was released off of the superior labrum and allowed to retract out of the shoulder. Glenoid humeral joint surfaces were intact and osteopenic chondromalacia. The supraspinatus had some partial-thickness tearing involving about the inner 20% of the tendon insertion. This was debrided extensively with the shaver back to healthy bleeding tendon.  Attention was then turned back to the subscapularis for repair. A large cannula was used anteriorly with a small cannula placed in a high lateral rotator interval portal position. The subscapularis was freed from surrounding soft tissues and mobilized. The lesser tuberosity was debrided down to bleeding bone with a bur. The repair was then carried out by placing an inverted mattress fiber tape suture through the good tendinous portion of the upper border subscapularis and into a 4.75 peak swivel lock anchor into the lesser tuberosity anatomically reducing the tendon back to its normal position. It was well fixed.  The arthroscope was then introduced into the subacromial space  a standard lateral portal was established with needle localization. The shaver was used through the lateral portal to perform extensive bursectomy. Coracoacromial ligament was examined and found to be totally intact with no signs of  impingement..   Attention was then turned to the axilla where a approximately 3 cm incision was made in the dominant axillary fold. This was about 50% above and 50% below the palpable lower border of the pectoralis major. Dissection was carried out between the lower border of the pectoralis major and the short head of the biceps muscle belly. The anterior humerus was then exposed and the long head biceps was delivered out through the wound. The biceps was prepared using a #2 FiberWire fiber loop and the remaining portion of the biceps tendon was discarded after choosing the appropriate tension and length. A drill bit slightly smaller than the tendon was used in the distal bicipital groove to create an intramedullary hole and then a drill bit about 12 mm distal to that was used which was slightly larger than the suture passer needle. A crescent suture lasso was then used to pass the sutures from proximal to distal and then one suture was brought around medial and lateral to the tendon. It was tensioned, dunking the tendon into the intramedullary canal and tied over the anterior portion of the tendon. The tension was felt to be appropriate. The wound was copiously irrigated with normal saline and subsequently closed in layers with 2-0 Vicryl in the deep dermal layer and Dermabond for skin closure.  the portals were closed with 3-0 nylon in an interrupted fashion. Sterile dressings were then applied including Xeroform 4 x 4's ABDs and tape. The patient was then allowed to awaken from general anesthesia, placed in a sling, transferred to the stretcher and taken to the recovery room in stable condition.   POSTOPERATIVE PLAN: The patient will be discharged home today and will followup in one week for suture removal and wound check.  She will follow the standard cuff protocol but we will keep her from external rotation beyond neutral for the first 6 weeks.

## 2016-02-20 NOTE — H&P (Signed)
Kathryn Castaneda is an 55 y.o. female.   Chief Complaint: L shoulder pain  HPI: s/p fall from horse with subscap tear and biceps dislocation.  Past Medical History  Diagnosis Date  . Hyperlipemia   . Thyroid disease   . Depression   . Acid reflux   . ADHD (attention deficit hyperactivity disorder)   . Hypothyroidism   . Anxiety   . Headache   . Arthritis   . Hepatitis C     had treatment 1990's    Past Surgical History  Procedure Laterality Date  . Joint replacement Right   . Abdominal hysterectomy    . Rotator cuff repair Right   . Cesarean section      History reviewed. No pertinent family history. Social History:  reports that she has never smoked. She does not have any smokeless tobacco history on file. She reports that she drinks alcohol. She reports that she does not use illicit drugs.  Allergies:  Allergies  Allergen Reactions  . Lactose Intolerance (Gi) Diarrhea    cramps  . Ultram [Tramadol Hcl] Other (See Comments)    PT's PCP does not want PT to take because of treatment with atterol and increased risk of seizures.    Medications Prior to Admission  Medication Sig Dispense Refill  . Adalimumab (HUMIRA PEN) 40 MG/0.8ML PNKT Inject into the skin every 14 (fourteen) days.    Marland Kitchen amphetamine-dextroamphetamine (ADDERALL XR) 25 MG 24 hr capsule Take 25 mg by mouth every morning.    . clonazePAM (KLONOPIN) 1 MG tablet Take 1 mg by mouth daily as needed for anxiety (sleep).     Marland Kitchen HYDROcodone-acetaminophen (NORCO/VICODIN) 5-325 MG tablet Take 1 tablet by mouth every 4 (four) hours as needed for moderate pain or severe pain. 15 tablet 0  . levothyroxine (SYNTHROID, LEVOTHROID) 100 MCG tablet Take 100 mcg by mouth daily before breakfast.    . lurasidone (LATUDA) 40 MG TABS tablet Take 40 mg by mouth daily with breakfast.    . methocarbamol (ROBAXIN) 500 MG tablet Take 2 tablets (1,000 mg total) by mouth 2 (two) times daily. 20 tablet 0  . Multiple Vitamin (MULTIVITAMIN  WITH MINERALS) TABS tablet Take 1 tablet by mouth daily.    . naproxen (NAPROSYN) 500 MG tablet Take 1 tablet (500 mg total) by mouth 2 (two) times daily with a meal. 30 tablet 0  . omeprazole (PRILOSEC) 20 MG capsule Take 20 mg by mouth daily.    Marland Kitchen PARoxetine (PAXIL) 30 MG tablet Take 60 mg by mouth every morning.     . propranolol (INDERAL) 20 MG tablet Take 20 mg by mouth 2 (two) times daily.       No results found for this or any previous visit (from the past 48 hour(s)). No results found.  Review of Systems  All other systems reviewed and are negative.   Blood pressure 179/105, pulse 55, temperature 98.3 F (36.8 C), temperature source Oral, resp. rate 14, height 5\' 6"  (1.676 m), weight 78.019 kg (172 lb), SpO2 100 %. Physical Exam  Constitutional: She is oriented to person, place, and time. She appears well-developed and well-nourished.  HENT:  Head: Atraumatic.  Eyes: EOM are normal.  Cardiovascular: Intact distal pulses.   Respiratory: Effort normal.  Musculoskeletal:  L shoulder pain with SS and biceps testing.  Neurological: She is alert and oriented to person, place, and time.  Skin: Skin is warm and dry.  Psychiatric: She has a normal mood and affect.  Assessment/Plan L ss tear and biceps dislocation Plan L ss repair and biceps tenodesis. Risks / benefits of surgery discussed Consent on chart  NPO for OR Preop antibiotics   Mable Paris, MD 02/20/2016, 12:42 PM

## 2016-02-20 NOTE — Discharge Instructions (Signed)
Discharge Instructions after Arthroscopic Shoulder Repair ° ° °A sling has been provided for you. Remain in your sling at all times. This includes sleeping in your sling.  °Use ice on the shoulder intermittently over the first 48 hours after surgery.  °Pain medicine has been prescribed for you.  °Use your medicine liberally over the first 48 hours, and then you can begin to taper your use. You may take Extra Strength Tylenol or Tylenol only in place of the pain pills. DO NOT take ANY nonsteroidal anti-inflammatory pain medications: Advil, Motrin, Ibuprofen, Aleve, Naproxen, or Narprosyn.  °You may remove your dressing after two days. If the incision sites are still moist, place a Band-Aid over the moist site(s). Change Band-Aids daily until dry.  °You may shower 5 days after surgery. The incisions CANNOT get wet prior to 5 days. Simply allow the water to wash over the site and then pat dry. Do not rub the incisions. Make sure your axilla (armpit) is completely dry after showering.  °Take one aspirin a day for 2 weeks after surgery, unless you have an aspirin sensitivity/ allergy or asthma. ° ° °Please call [phone] during normal business hours or [phone] after hours for any problems. Including the following: ° °- excessive redness of the incisions °- drainage for more than 4 days °- fever of more than 101.5 F ° °*Please note that pain medications will not be refilled after hours or on weekends. ° ° ° °Post Anesthesia Home Care Instructions ° °Activity: °Get plenty of rest for the remainder of the day. A responsible adult should stay with you for 24 hours following the procedure.  °For the next 24 hours, DO NOT: °-Drive a car °-Operate machinery °-Drink alcoholic beverages °-Take any medication unless instructed by your physician °-Make any legal decisions or sign important papers. ° °Meals: °Start with liquid foods such as gelatin or soup. Progress to regular foods as tolerated. Avoid greasy, spicy, heavy  foods. If nausea and/or vomiting occur, drink only clear liquids until the nausea and/or vomiting subsides. Call your physician if vomiting continues. ° °Special Instructions/Symptoms: °Your throat may feel dry or sore from the anesthesia or the breathing tube placed in your throat during surgery. If this causes discomfort, gargle with warm salt water. The discomfort should disappear within 24 hours. ° °If you had a scopolamine patch placed behind your ear for the management of post- operative nausea and/or vomiting: ° °1. The medication in the patch is effective for 72 hours, after which it should be removed.  Wrap patch in a tissue and discard in the trash. Wash hands thoroughly with soap and water. °2. You may remove the patch earlier than 72 hours if you experience unpleasant side effects which may include dry mouth, dizziness or visual disturbances. °3. Avoid touching the patch. Wash your hands with soap and water after contact with the patch. °  ° °Regional Anesthesia Blocks ° °1. Numbness or the inability to move the "blocked" extremity may last from 3-48 hours after placement. The length of time depends on the medication injected and your individual response to the medication. If the numbness is not going away after 48 hours, call your surgeon. ° °2. The extremity that is blocked will need to be protected until the numbness is gone and the  Strength has returned. Because you cannot feel it, you will need to take extra care to avoid injury. Because it may be weak, you may have difficulty moving it or using it. You may   not know what position it is in without looking at it while the block is in effect. ° °3. For blocks in the legs and feet, returning to weight bearing and walking needs to be done carefully. You will need to wait until the numbness is entirely gone and the strength has returned. You should be able to move your leg and foot normally before you try and bear weight or walk. You will need someone to  be with you when you first try to ensure you do not fall and possibly risk injury. ° °4. Bruising and tenderness at the needle site are common side effects and will resolve in a few days. ° °5. Persistent numbness or new problems with movement should be communicated to the surgeon or the [facility] ([phone])/ [facility] ([phone]). ° ° °Call your surgeon if you experience:  ° °1.  Fever over 101.0. °2.  Inability to urinate. °3.  Nausea and/or vomiting. °4.  Extreme swelling or bruising at the surgical site. °5.  Continued bleeding from the incision. °6.  Increased pain, redness or drainage from the incision. °7.  Problems related to your pain medication. °8. Any change in color, movement and/or sensation °9. Any problems and/or concerns °

## 2016-02-20 NOTE — Anesthesia Procedure Notes (Addendum)
Anesthesia Regional Block:  Interscalene brachial plexus block  Pre-Anesthetic Checklist: ,, timeout performed, Correct Patient, Correct Site, Correct Laterality, Correct Procedure, Correct Position, site marked, Risks and benefits discussed,  Surgical consent,  Pre-op evaluation,  At surgeon's request and post-op pain management  Laterality: Left and Upper  Prep: chloraprep       Needles:  Injection technique: Single-shot  Needle Type: Echogenic Needle     Needle Length: 5cm 5 cm Needle Gauge: 21 and 21 G    Additional Needles:  Procedures: ultrasound guided (picture in chart) Interscalene brachial plexus block Narrative:  Start time: 02/20/2016 11:37 AM End time: 02/20/2016 11:42 AM Injection made incrementally with aspirations every 5 mL.  Performed by: Personally  Anesthesiologist: CREWS, DAVID   Procedure Name: Intubation Date/Time: 02/20/2016 12:53 PM Performed by: [place] Desanctis Pre-anesthesia Checklist: Patient identified, Emergency Drugs available, Suction available, Patient being monitored and Timeout performed Patient Re-evaluated:Patient Re-evaluated prior to inductionOxygen Delivery Method: Circle System Utilized Preoxygenation: Pre-oxygenation with 100% oxygen Intubation Type: IV induction Ventilation: Mask ventilation without difficulty Laryngoscope Size: Miller and 3 Grade View: Grade II Tube type: Oral Tube size: 7.0 mm Number of attempts: 1 Airway Equipment and Method: Stylet and Oral airway Placement Confirmation: ETT inserted through vocal cords under direct vision,  positive ETCO2 and breath sounds checked- equal and bilateral Secured at: 21 cm Tube secured with: Tape Dental Injury: Teeth and Oropharynx as per pre-operative assessment        Left Interscalene block image

## 2016-02-20 NOTE — Progress Notes (Signed)
  Assisted Dr. Crews with left, ultrasound guided, supraclavicular block. Side rails up, monitors on throughout procedure. See vital signs in flow sheet. Tolerated Procedure well. 

## 2016-02-20 NOTE — Transfer of Care (Signed)
Immediate Anesthesia Transfer of Care Note  Patient: Kathryn Castaneda  Procedure(s) Performed: Procedure(s): SHOULDER ARTHROSCOPY SUPSCAPULARIS REPAIR WITH BICEPS TENOTOMY OPEN TENODESIS (Left)  Patient Location: PACU  Anesthesia Type:GA combined with regional for post-op pain  Level of Consciousness: awake and patient cooperative  Airway & Oxygen Therapy: Patient Spontanous Breathing and Patient connected to face mask oxygen  Post-op Assessment: Report given to RN and Post -op Vital signs reviewed and stable  Post vital signs: Reviewed and stable  Last Vitals:  Filed Vitals:   02/20/16 1421 02/20/16 1423  BP: 134/100 137/103  Pulse: 103 102  Temp:    Resp:  33    Complications: No apparent anesthesia complications

## 2016-02-20 NOTE — Anesthesia Postprocedure Evaluation (Signed)
Anesthesia Post Note  Patient: Elania Crowl  Procedure(s) Performed: Procedure(s) (LRB): SHOULDER ARTHROSCOPY SUPSCAPULARIS REPAIR WITH BICEPS TENOTOMY OPEN TENODESIS (Left)  Patient location during evaluation: PACU Anesthesia Type: General Level of consciousness: awake and alert Pain management: pain level controlled Vital Signs Assessment: post-procedure vital signs reviewed and stable Respiratory status: spontaneous breathing, nonlabored ventilation and respiratory function stable Cardiovascular status: blood pressure returned to baseline and stable Postop Assessment: no signs of nausea or vomiting Anesthetic complications: no    Last Vitals:  Filed Vitals:   02/20/16 1500 02/20/16 1515  BP: 133/93 131/94  Pulse: 92 85  Temp:    Resp: 19 19    Last Pain:  Filed Vitals:   02/20/16 1516  PainSc: 0-No pain                 Paula Zietz A

## 2016-02-21 ENCOUNTER — Encounter (HOSPITAL_BASED_OUTPATIENT_CLINIC_OR_DEPARTMENT_OTHER): Payer: Self-pay | Admitting: Orthopedic Surgery

## 2016-02-26 MED FILL — PROPRANOLOL 20 MG TABLET: 20 | 90 days supply | Qty: 180 | Fill #0

## 2016-02-28 MED FILL — HYDROCODON-APAP 5-325: 5-325 | 5 days supply | Qty: 40 | Fill #0

## 2016-03-04 MED FILL — HYDROCODON-APAP 5-325: 5-325 | 15 days supply | Qty: 30 | Fill #0

## 2016-03-05 DIAGNOSIS — M25512 Pain in left shoulder: Secondary | ICD-10-CM | POA: Diagnosis not present

## 2016-03-05 DIAGNOSIS — M6281 Muscle weakness (generalized): Secondary | ICD-10-CM | POA: Diagnosis not present

## 2016-03-05 DIAGNOSIS — M25612 Stiffness of left shoulder, not elsewhere classified: Secondary | ICD-10-CM | POA: Diagnosis not present

## 2016-03-07 MED FILL — HUMIRA PEN 40 MG/0.8ML PNKT: 40 | 28 days supply | Qty: 2 | Fill #2

## 2016-03-08 DIAGNOSIS — M25512 Pain in left shoulder: Secondary | ICD-10-CM | POA: Diagnosis not present

## 2016-03-08 DIAGNOSIS — M6281 Muscle weakness (generalized): Secondary | ICD-10-CM | POA: Diagnosis not present

## 2016-03-08 DIAGNOSIS — M25612 Stiffness of left shoulder, not elsewhere classified: Secondary | ICD-10-CM | POA: Diagnosis not present

## 2016-03-14 DIAGNOSIS — M25612 Stiffness of left shoulder, not elsewhere classified: Secondary | ICD-10-CM | POA: Diagnosis not present

## 2016-03-14 DIAGNOSIS — M25512 Pain in left shoulder: Secondary | ICD-10-CM | POA: Diagnosis not present

## 2016-03-14 DIAGNOSIS — M6281 Muscle weakness (generalized): Secondary | ICD-10-CM | POA: Diagnosis not present

## 2016-03-19 DIAGNOSIS — M25512 Pain in left shoulder: Secondary | ICD-10-CM | POA: Diagnosis not present

## 2016-03-19 DIAGNOSIS — M6281 Muscle weakness (generalized): Secondary | ICD-10-CM | POA: Diagnosis not present

## 2016-03-19 DIAGNOSIS — M25612 Stiffness of left shoulder, not elsewhere classified: Secondary | ICD-10-CM | POA: Diagnosis not present

## 2016-03-19 MED FILL — LATUDA 60 MG TABLET: 60 | 30 days supply | Qty: 30 | Fill #1

## 2016-03-19 MED FILL — DEXTROAMP-AMPHET ER 20 MG C: 20 | 30 days supply | Qty: 30 | Fill #0

## 2016-03-19 MED FILL — clonazePAM 1 MG TABS: 1 | 30 days supply | Qty: 30 | Fill #1

## 2016-03-21 DIAGNOSIS — M25512 Pain in left shoulder: Secondary | ICD-10-CM | POA: Diagnosis not present

## 2016-03-21 DIAGNOSIS — M6281 Muscle weakness (generalized): Secondary | ICD-10-CM | POA: Diagnosis not present

## 2016-03-21 DIAGNOSIS — M25612 Stiffness of left shoulder, not elsewhere classified: Secondary | ICD-10-CM | POA: Diagnosis not present

## 2016-04-01 DIAGNOSIS — Z9889 Other specified postprocedural states: Secondary | ICD-10-CM | POA: Diagnosis not present

## 2016-04-02 DIAGNOSIS — M25512 Pain in left shoulder: Secondary | ICD-10-CM | POA: Diagnosis not present

## 2016-04-02 DIAGNOSIS — M25612 Stiffness of left shoulder, not elsewhere classified: Secondary | ICD-10-CM | POA: Diagnosis not present

## 2016-04-02 DIAGNOSIS — M6281 Muscle weakness (generalized): Secondary | ICD-10-CM | POA: Diagnosis not present

## 2016-04-02 MED FILL — PARoxetine HCL 30 MG TABS: 30 | 30 days supply | Qty: 30 | Fill #0

## 2016-04-03 DIAGNOSIS — L438 Other lichen planus: Secondary | ICD-10-CM | POA: Diagnosis not present

## 2016-04-03 DIAGNOSIS — L439 Lichen planus, unspecified: Secondary | ICD-10-CM | POA: Diagnosis not present

## 2016-04-04 DIAGNOSIS — M25512 Pain in left shoulder: Secondary | ICD-10-CM | POA: Diagnosis not present

## 2016-04-04 DIAGNOSIS — M6281 Muscle weakness (generalized): Secondary | ICD-10-CM | POA: Diagnosis not present

## 2016-04-04 DIAGNOSIS — M25612 Stiffness of left shoulder, not elsewhere classified: Secondary | ICD-10-CM | POA: Diagnosis not present

## 2016-04-08 MED FILL — SYNTHROID 100 MCG TABLET: 100 | 90 days supply | Qty: 90 | Fill #1

## 2016-04-08 MED FILL — HUMIRA PEN 40 MG/0.8ML PNKT: 40 | 28 days supply | Qty: 2 | Fill #0

## 2016-04-10 MED FILL — CLOBETASOL 0.05% CREAM: 0.05 | 15 days supply | Qty: 60 | Fill #0

## 2016-04-15 MED FILL — OMEPRAZOLE DR 20 MG CAPSULE: 20 | 90 days supply | Qty: 90 | Fill #0

## 2016-04-15 MED FILL — LATUDA 60 MG TABLET: 60 | 30 days supply | Qty: 30 | Fill #0

## 2016-04-16 DIAGNOSIS — M25512 Pain in left shoulder: Secondary | ICD-10-CM | POA: Diagnosis not present

## 2016-04-16 DIAGNOSIS — M25612 Stiffness of left shoulder, not elsewhere classified: Secondary | ICD-10-CM | POA: Diagnosis not present

## 2016-04-16 DIAGNOSIS — M6281 Muscle weakness (generalized): Secondary | ICD-10-CM | POA: Diagnosis not present

## 2016-04-18 MED FILL — ESTRACE 0.01% CREAM: 0.1 | 30 days supply | Qty: 43 | Fill #0

## 2016-04-22 DIAGNOSIS — M6281 Muscle weakness (generalized): Secondary | ICD-10-CM | POA: Diagnosis not present

## 2016-04-22 DIAGNOSIS — M25512 Pain in left shoulder: Secondary | ICD-10-CM | POA: Diagnosis not present

## 2016-04-22 DIAGNOSIS — M25612 Stiffness of left shoulder, not elsewhere classified: Secondary | ICD-10-CM | POA: Diagnosis not present

## 2016-04-24 DIAGNOSIS — M25612 Stiffness of left shoulder, not elsewhere classified: Secondary | ICD-10-CM | POA: Diagnosis not present

## 2016-04-24 DIAGNOSIS — M25512 Pain in left shoulder: Secondary | ICD-10-CM | POA: Diagnosis not present

## 2016-04-24 DIAGNOSIS — M150 Primary generalized (osteo)arthritis: Secondary | ICD-10-CM | POA: Diagnosis not present

## 2016-04-24 DIAGNOSIS — L4059 Other psoriatic arthropathy: Secondary | ICD-10-CM | POA: Diagnosis not present

## 2016-04-24 DIAGNOSIS — M6281 Muscle weakness (generalized): Secondary | ICD-10-CM | POA: Diagnosis not present

## 2016-04-29 DIAGNOSIS — M25612 Stiffness of left shoulder, not elsewhere classified: Secondary | ICD-10-CM | POA: Diagnosis not present

## 2016-04-29 DIAGNOSIS — M25512 Pain in left shoulder: Secondary | ICD-10-CM | POA: Diagnosis not present

## 2016-04-29 DIAGNOSIS — M6281 Muscle weakness (generalized): Secondary | ICD-10-CM | POA: Diagnosis not present

## 2016-04-29 MED FILL — DEXTROAMP-AMPHET ER 20 MG C: 20 | 30 days supply | Qty: 30 | Fill #0

## 2016-05-01 DIAGNOSIS — M6281 Muscle weakness (generalized): Secondary | ICD-10-CM | POA: Diagnosis not present

## 2016-05-01 DIAGNOSIS — M25512 Pain in left shoulder: Secondary | ICD-10-CM | POA: Diagnosis not present

## 2016-05-01 DIAGNOSIS — M25612 Stiffness of left shoulder, not elsewhere classified: Secondary | ICD-10-CM | POA: Diagnosis not present

## 2016-05-03 DIAGNOSIS — Z9889 Other specified postprocedural states: Secondary | ICD-10-CM | POA: Diagnosis not present

## 2016-05-07 MED FILL — PARoxetine HCL 30 MG TABS: 30 | 30 days supply | Qty: 30 | Fill #1

## 2016-05-07 MED FILL — HUMIRA PEN 40 MG/0.8ML PNKT: 40 | 28 days supply | Qty: 2 | Fill #1

## 2016-05-09 DIAGNOSIS — M25612 Stiffness of left shoulder, not elsewhere classified: Secondary | ICD-10-CM | POA: Diagnosis not present

## 2016-05-09 DIAGNOSIS — M25512 Pain in left shoulder: Secondary | ICD-10-CM | POA: Diagnosis not present

## 2016-05-09 DIAGNOSIS — M6281 Muscle weakness (generalized): Secondary | ICD-10-CM | POA: Diagnosis not present

## 2016-05-10 MED FILL — clonazePAM 1 MG TABS: 1 | 30 days supply | Qty: 30 | Fill #2

## 2016-05-14 DIAGNOSIS — M6281 Muscle weakness (generalized): Secondary | ICD-10-CM | POA: Diagnosis not present

## 2016-05-14 DIAGNOSIS — M25512 Pain in left shoulder: Secondary | ICD-10-CM | POA: Diagnosis not present

## 2016-05-14 DIAGNOSIS — M25612 Stiffness of left shoulder, not elsewhere classified: Secondary | ICD-10-CM | POA: Diagnosis not present

## 2016-05-16 DIAGNOSIS — M25512 Pain in left shoulder: Secondary | ICD-10-CM | POA: Diagnosis not present

## 2016-05-16 DIAGNOSIS — M25612 Stiffness of left shoulder, not elsewhere classified: Secondary | ICD-10-CM | POA: Diagnosis not present

## 2016-05-16 DIAGNOSIS — M6281 Muscle weakness (generalized): Secondary | ICD-10-CM | POA: Diagnosis not present

## 2016-05-17 MED FILL — LATUDA 80 MG TABLET: 80 | 30 days supply | Qty: 30 | Fill #0

## 2016-05-20 DIAGNOSIS — M25512 Pain in left shoulder: Secondary | ICD-10-CM | POA: Diagnosis not present

## 2016-05-20 DIAGNOSIS — M25612 Stiffness of left shoulder, not elsewhere classified: Secondary | ICD-10-CM | POA: Diagnosis not present

## 2016-05-20 DIAGNOSIS — M6281 Muscle weakness (generalized): Secondary | ICD-10-CM | POA: Diagnosis not present

## 2016-05-24 MED FILL — ESTRACE 0.01% CREAM: 0.1 | 30 days supply | Qty: 43 | Fill #1

## 2016-05-27 DIAGNOSIS — M25612 Stiffness of left shoulder, not elsewhere classified: Secondary | ICD-10-CM | POA: Diagnosis not present

## 2016-05-27 DIAGNOSIS — M6281 Muscle weakness (generalized): Secondary | ICD-10-CM | POA: Diagnosis not present

## 2016-05-27 DIAGNOSIS — M25512 Pain in left shoulder: Secondary | ICD-10-CM | POA: Diagnosis not present

## 2016-05-31 MED FILL — RELPAX 20 MG TABLET: 20 | 30 days supply | Qty: 9 | Fill #1

## 2016-06-03 DIAGNOSIS — F39 Unspecified mood [affective] disorder: Secondary | ICD-10-CM | POA: Diagnosis not present

## 2016-06-03 DIAGNOSIS — M25512 Pain in left shoulder: Secondary | ICD-10-CM | POA: Diagnosis not present

## 2016-06-03 DIAGNOSIS — M6281 Muscle weakness (generalized): Secondary | ICD-10-CM | POA: Diagnosis not present

## 2016-06-03 DIAGNOSIS — M25612 Stiffness of left shoulder, not elsewhere classified: Secondary | ICD-10-CM | POA: Diagnosis not present

## 2016-06-03 DIAGNOSIS — F902 Attention-deficit hyperactivity disorder, combined type: Secondary | ICD-10-CM | POA: Diagnosis not present

## 2016-06-03 MED FILL — PROPRANOLOL 20 MG TABLET: 20 | 90 days supply | Qty: 180 | Fill #1

## 2016-06-03 MED FILL — PARoxetine HCL 30 MG TABS: 30 | 30 days supply | Qty: 30 | Fill #0

## 2016-06-03 MED FILL — HUMIRA PEN 40 MG/0.8ML PNKT: 40 | 28 days supply | Qty: 2 | Fill #2

## 2016-06-04 MED FILL — DEXTROAMP-AMPHET ER 20 MG C: 20 | 30 days supply | Qty: 30 | Fill #0

## 2016-06-10 MED FILL — clonazePAM 1 MG TABS: 1 | 30 days supply | Qty: 30 | Fill #0

## 2016-06-12 DIAGNOSIS — M25512 Pain in left shoulder: Secondary | ICD-10-CM | POA: Diagnosis not present

## 2016-06-13 ENCOUNTER — Telehealth: Payer: Self-pay | Admitting: Pharmacist

## 2016-06-13 NOTE — Telephone Encounter (Signed)
Called patient to schedule appointment with the [facility]. I was unable to reach her but left a HIPAA-compliant message requesting that she return my call.

## 2016-06-18 ENCOUNTER — Ambulatory Visit (HOSPITAL_BASED_OUTPATIENT_CLINIC_OR_DEPARTMENT_OTHER): Payer: 59 | Admitting: Pharmacist

## 2016-06-18 DIAGNOSIS — M069 Rheumatoid arthritis, unspecified: Secondary | ICD-10-CM

## 2016-06-18 NOTE — Progress Notes (Signed)
   S: Patient presents today to the [facility].  Patient is currently taking Humira for rheumatoid arthritis. Patient is managed by Dr. Dierdre Forth for this. She reports that Humira is working very well for her arthritis and that she has regained a lot of functionality since starting the medication. She has recently had shoulder surgery and has had to sleep in a chair which has caused some stiffness and pain but otherwise, is feeling very well.  Adherence: denies any missed doses.  Dosing:  Rheumatoid arthritis: SubQ: 40 mg every other week (may continue methotrexate, other nonbiologic DMARDS, corticosteroids, NSAIDs, and/or analgesics); patients not taking concomitant methotrexate may increase dose to 40 mg every week  Drug-drug interactions: none  Screening: TB test: completed per patient Hepatitis: completed per patient  Monitoring: S/sx of infection: denies CBC: done 3 weeks ago - WNL per patient S/sx of hypersensitivity: denies S/sx of malignancy: denies S/sx of heart failure: denies    O:     Lab Results  Component Value Date   WBC 2.9* 05/31/2013   HGB 11.6* 05/31/2013   HCT 34.6* 05/31/2013   MCV 90.3 05/31/2013   PLT 154 05/31/2013      Chemistry      Component Value Date/Time   NA 139 05/31/2013 0530   K 3.8 05/31/2013 0530   CL 109 05/31/2013 0530   CO2 24 05/31/2013 0530   BUN <3* 05/31/2013 0530   CREATININE 0.69 05/31/2013 0530      Component Value Date/Time   CALCIUM 8.7 05/31/2013 0530   ALKPHOS 69 05/31/2013 0530   AST 17 05/31/2013 0530   ALT 6 05/31/2013 0530   BILITOT 0.2* 05/31/2013 0530       A/P: 1. Medication review: patient on Humira for RA, tolerating it well with no adverse effects and improved control of RA. Reviewed the medication with her, including the following: Humira is a TNF blocking agent indicated for ankylosing spondylitis, Crohn's disease, Hidradenitis suppurativa, psoriatic  arthritis, plaque psoriasis, ulcerative colitis, and uveitis. The most common adverse effects are infections, headache, and injection site reactions. There is the possibility of an increased risk of malignancy but it is not well understood if this increased risk is due to there medication or the disease state. There are rare cases of pancytopenia and aplastic anemia. No recommendations for any changes.   Juanita Craver, PharmD, BCPS, CPP Clinical Pharmacist Practitioner  [facility] [phone]

## 2016-06-21 MED FILL — LATUDA 80 MG TABLET: 80 | 30 days supply | Qty: 30 | Fill #0

## 2016-06-28 ENCOUNTER — Other Ambulatory Visit: Payer: Self-pay | Admitting: Pharmacist

## 2016-06-28 MED ORDER — ADALIMUMAB 40 MG/0.8ML ~~LOC~~ AJKT: 0.8000 mL | AUTO-INJECTOR | SUBCUTANEOUS | Status: DC

## 2016-06-28 MED FILL — HUMIRA PEN 40 MG/0.8ML PNKT: 40 | 27 days supply | Qty: 2 | Fill #0

## 2016-07-02 MED FILL — DEXTROAMP-AMPHET ER 20 MG C: 20 | 30 days supply | Qty: 30 | Fill #0

## 2016-07-12 MED FILL — clonazePAM 1 MG TABS: 1 | 30 days supply | Qty: 30 | Fill #1

## 2016-07-15 MED FILL — SYNTHROID 100 MCG TABLET: 100 | 90 days supply | Qty: 90 | Fill #0

## 2016-07-15 MED FILL — PARoxetine HCL 30 MG TABS: 30 | 30 days supply | Qty: 30 | Fill #1

## 2016-07-23 MED FILL — LATUDA 80 MG TABLET: 80 | 30 days supply | Qty: 30 | Fill #1

## 2016-07-24 MED FILL — ESTRACE 0.01% CREAM: 0.1 | 84 days supply | Qty: 43 | Fill #0

## 2016-07-24 MED FILL — OMEPRAZOLE DR 20 MG CAPSULE: 20 | 90 days supply | Qty: 90 | Fill #0

## 2016-07-29 MED FILL — HUMIRA PEN 40 MG/0.8ML PNKT: 40 | 27 days supply | Qty: 2 | Fill #1

## 2016-07-30 DIAGNOSIS — E039 Hypothyroidism, unspecified: Secondary | ICD-10-CM | POA: Diagnosis not present

## 2016-07-30 DIAGNOSIS — H9212 Otorrhea, left ear: Secondary | ICD-10-CM | POA: Diagnosis not present

## 2016-07-30 DIAGNOSIS — H9211 Otorrhea, right ear: Secondary | ICD-10-CM | POA: Diagnosis not present

## 2016-07-30 DIAGNOSIS — G43909 Migraine, unspecified, not intractable, without status migrainosus: Secondary | ICD-10-CM | POA: Diagnosis not present

## 2016-07-30 DIAGNOSIS — F339 Major depressive disorder, recurrent, unspecified: Secondary | ICD-10-CM | POA: Diagnosis not present

## 2016-07-30 DIAGNOSIS — Z8619 Personal history of other infectious and parasitic diseases: Secondary | ICD-10-CM | POA: Diagnosis not present

## 2016-07-30 DIAGNOSIS — E7800 Pure hypercholesterolemia, unspecified: Secondary | ICD-10-CM | POA: Diagnosis not present

## 2016-07-30 DIAGNOSIS — Z79899 Other long term (current) drug therapy: Secondary | ICD-10-CM | POA: Diagnosis not present

## 2016-07-30 DIAGNOSIS — F419 Anxiety disorder, unspecified: Secondary | ICD-10-CM | POA: Diagnosis not present

## 2016-08-06 MED FILL — DEXTROAMP-AMPHET ER 20 MG C: 20 | 30 days supply | Qty: 30 | Fill #0

## 2016-08-15 MED FILL — clonazePAM 1 MG TABS: 1 | 30 days supply | Qty: 30 | Fill #2

## 2016-08-21 MED FILL — PARoxetine HCL 30 MG TABS: 30 | 30 days supply | Qty: 30 | Fill #2

## 2016-08-28 DIAGNOSIS — H60333 Swimmer's ear, bilateral: Secondary | ICD-10-CM | POA: Diagnosis not present

## 2016-08-28 MED FILL — FLUOCINOLONE OIL 0.01% EAR: 0.01 | 90 days supply | Qty: 20 | Fill #0

## 2016-08-29 MED FILL — LATUDA 80 MG TABLET: 80 | 30 days supply | Qty: 30 | Fill #2

## 2016-08-30 MED FILL — PROPRANOLOL 20 MG TABLET: 20 | 90 days supply | Qty: 180 | Fill #0

## 2016-09-02 DIAGNOSIS — F902 Attention-deficit hyperactivity disorder, combined type: Secondary | ICD-10-CM | POA: Diagnosis not present

## 2016-09-02 DIAGNOSIS — F39 Unspecified mood [affective] disorder: Secondary | ICD-10-CM | POA: Diagnosis not present

## 2016-09-02 MED FILL — LORazepam 1 MG TABS: 1 | 30 days supply | Qty: 30 | Fill #0

## 2016-09-02 MED FILL — PARoxetine HCL 20 MG TABS: 20 | 30 days supply | Qty: 60 | Fill #0

## 2016-09-03 MED FILL — DEXTROAMP-AMPHET ER 20 MG C: 20 | 30 days supply | Qty: 30 | Fill #0

## 2016-09-09 MED FILL — HUMIRA PEN 40 MG/0.8ML PNKT: 40 | 27 days supply | Qty: 2 | Fill #2

## 2016-09-26 DIAGNOSIS — F902 Attention-deficit hyperactivity disorder, combined type: Secondary | ICD-10-CM | POA: Diagnosis not present

## 2016-09-26 DIAGNOSIS — F39 Unspecified mood [affective] disorder: Secondary | ICD-10-CM | POA: Diagnosis not present

## 2016-09-26 MED FILL — traZODone HCL 50 MG TABS: 50 | 30 days supply | Qty: 60 | Fill #0

## 2016-09-26 MED FILL — clonazePAM 1 MG TABS: 1 | 30 days supply | Qty: 30 | Fill #0

## 2016-10-01 MED FILL — LATUDA 80 MG TABLET: 80 | 30 days supply | Qty: 30 | Fill #0

## 2016-10-01 MED FILL — PARoxetine HCL 20 MG TABS: 20 | 30 days supply | Qty: 60 | Fill #1

## 2016-10-03 MED FILL — DEXTROAMP-AMPHET ER 20 MG C: 20 | 30 days supply | Qty: 30 | Fill #0

## 2016-10-04 MED FILL — HUMIRA PEN 40 MG/0.8ML PNKT: 40 | 27 days supply | Qty: 2 | Fill #3

## 2016-10-04 MED FILL — ESTRACE 0.01% CREAM: 0.1 | 84 days supply | Qty: 43 | Fill #0

## 2016-10-07 MED FILL — ELETRIPTAN HBR 20 MG TABLET: 20 | 30 days supply | Qty: 9 | Fill #0

## 2016-10-28 MED FILL — OMEPRAZOLE DR 20 MG CAPSULE: 20 | 90 days supply | Qty: 90 | Fill #0

## 2016-10-28 MED FILL — SYNTHROID 100 MCG TABLET: 100 | 90 days supply | Qty: 90 | Fill #1

## 2016-11-03 MED FILL — PARoxetine HCL 20 MG TABS: 20 | 30 days supply | Qty: 60 | Fill #2

## 2016-11-03 MED FILL — traZODone HCL 50 MG TABS: 50 | 30 days supply | Qty: 60 | Fill #1

## 2016-11-03 MED FILL — LATUDA 80 MG TABLET: 80 | 30 days supply | Qty: 30 | Fill #1

## 2016-11-03 MED FILL — HUMIRA PEN 40 MG/0.8ML PNKT: 40 | 27 days supply | Qty: 2 | Fill #4

## 2016-11-04 MED FILL — clonazePAM 1 MG TABS: 1 | 30 days supply | Qty: 30 | Fill #1

## 2016-11-07 MED FILL — DEXTROAMP-AMPHET ER 20 MG C: 20 | 30 days supply | Qty: 30 | Fill #0

## 2016-11-22 MED FILL — CYCLOBENZAPRINE 5 MG TABLET: 5 | 13 days supply | Qty: 25 | Fill #0

## 2016-11-22 MED FILL — NAPROXEN 500 MG TABLET: 500 | 20 days supply | Qty: 40 | Fill #0

## 2016-12-03 MED FILL — HUMIRA PEN 40 MG/0.8ML PNKT: 40 | 27 days supply | Qty: 2 | Fill #5

## 2016-12-03 MED FILL — ESTRACE 0.01% CREAM: 0.1 | 90 days supply | Qty: 43 | Fill #1

## 2016-12-03 MED FILL — LATUDA 80 MG TABLET: 80 | 30 days supply | Qty: 30 | Fill #2

## 2016-12-03 MED FILL — PROPRANOLOL 20 MG TABLET: 20 | 90 days supply | Qty: 180 | Fill #1

## 2016-12-05 MED FILL — PARoxetine HCL 20 MG TABS: 20 | 30 days supply | Qty: 60 | Fill #0

## 2016-12-05 MED FILL — DEXTROAMP-AMPHET ER 20 MG C: 20 | 30 days supply | Qty: 30 | Fill #0

## 2016-12-11 DIAGNOSIS — H60333 Swimmer's ear, bilateral: Secondary | ICD-10-CM | POA: Diagnosis not present

## 2016-12-11 MED FILL — FLUOCINOLONE OIL 0.01% EAR: 0.01 | 10 days supply | Qty: 20 | Fill #0

## 2016-12-27 MED FILL — traZODone HCL 50 MG TABS: 50 | 30 days supply | Qty: 60 | Fill #2

## 2016-12-27 MED FILL — HUMIRA PEN 40 MG/0.8ML PNKT: 40 | 28 days supply | Qty: 2 | Fill #6

## 2017-01-06 MED FILL — clonazePAM 1 MG TABS: 1 | 30 days supply | Qty: 30 | Fill #0

## 2017-01-09 MED FILL — PARoxetine HCL 20 MG TABS: 20 | 30 days supply | Qty: 60 | Fill #0

## 2017-01-09 MED FILL — LATUDA 80 MG TABLET: 80 | 30 days supply | Qty: 30 | Fill #0

## 2017-01-09 MED FILL — ADDERALL XR 20 MG CAP SA: 20 | 30 days supply | Qty: 30 | Fill #0

## 2017-01-23 DIAGNOSIS — L409 Psoriasis, unspecified: Secondary | ICD-10-CM | POA: Diagnosis not present

## 2017-01-23 DIAGNOSIS — M150 Primary generalized (osteo)arthritis: Secondary | ICD-10-CM | POA: Diagnosis not present

## 2017-01-23 DIAGNOSIS — L4059 Other psoriatic arthropathy: Secondary | ICD-10-CM | POA: Diagnosis not present

## 2017-01-23 DIAGNOSIS — E663 Overweight: Secondary | ICD-10-CM | POA: Diagnosis not present

## 2017-01-23 DIAGNOSIS — Z6829 Body mass index (BMI) 29.0-29.9, adult: Secondary | ICD-10-CM | POA: Diagnosis not present

## 2017-02-03 MED FILL — HUMIRA PEN 40 MG/0.8ML PNKT: 40 | 28 days supply | Qty: 2 | Fill #7

## 2017-02-03 MED FILL — SYNTHROID 100 MCG TABLET: 100 | 90 days supply | Qty: 90 | Fill #0

## 2017-02-03 MED FILL — OMEPRAZOLE DR 20 MG CAPSULE: 20 | 90 days supply | Qty: 90 | Fill #1

## 2017-02-06 DIAGNOSIS — F39 Unspecified mood [affective] disorder: Secondary | ICD-10-CM | POA: Diagnosis not present

## 2017-02-06 DIAGNOSIS — F902 Attention-deficit hyperactivity disorder, combined type: Secondary | ICD-10-CM | POA: Diagnosis not present

## 2017-02-06 MED FILL — LATUDA 80 MG TABLET: 80 | 30 days supply | Qty: 30 | Fill #0

## 2017-02-07 MED FILL — ADDERALL XR 20 MG CAP SA: 20 | 30 days supply | Qty: 30 | Fill #0

## 2017-02-18 DIAGNOSIS — H31092 Other chorioretinal scars, left eye: Secondary | ICD-10-CM | POA: Diagnosis not present

## 2017-02-18 DIAGNOSIS — H43822 Vitreomacular adhesion, left eye: Secondary | ICD-10-CM | POA: Diagnosis not present

## 2017-02-18 DIAGNOSIS — H43813 Vitreous degeneration, bilateral: Secondary | ICD-10-CM | POA: Diagnosis not present

## 2017-02-18 DIAGNOSIS — H2513 Age-related nuclear cataract, bilateral: Secondary | ICD-10-CM | POA: Diagnosis not present

## 2017-02-20 MED FILL — TRINTELLIX 20 MG TABLET: 20 | 30 days supply | Qty: 30 | Fill #0

## 2017-02-25 MED FILL — clonazePAM 1 MG TABS: 1 | 30 days supply | Qty: 30 | Fill #1

## 2017-02-25 MED FILL — ESTRADIOL 0.1 MG/GM CRM: 0.1 | 90 days supply | Qty: 43 | Fill #2

## 2017-02-28 MED FILL — traZODone HCL 50 MG TABS: 50 | 30 days supply | Qty: 60 | Fill #0

## 2017-03-03 MED FILL — ELETRIPTAN HBR 20 MG TABLET: 20 | 30 days supply | Qty: 9 | Fill #1

## 2017-03-04 DIAGNOSIS — R51 Headache: Secondary | ICD-10-CM | POA: Diagnosis not present

## 2017-03-04 MED FILL — PROPRANOLOL 20 MG TABLET: 20 | 30 days supply | Qty: 90 | Fill #0

## 2017-03-07 MED FILL — ADDERALL XR 20 MG CAP SA: 20 | 30 days supply | Qty: 30 | Fill #0

## 2017-03-12 MED FILL — HUMIRA PEN 40 MG/0.8ML PNKT: 40 | 28 days supply | Qty: 2 | Fill #8

## 2017-03-14 MED FILL — LATUDA 80 MG TABLET: 80 | 30 days supply | Qty: 30 | Fill #1

## 2017-03-27 MED FILL — PROPRANOLOL 20 MG TABLET: 20 | 30 days supply | Qty: 120 | Fill #0

## 2017-04-02 DIAGNOSIS — F902 Attention-deficit hyperactivity disorder, combined type: Secondary | ICD-10-CM | POA: Diagnosis not present

## 2017-04-02 DIAGNOSIS — F39 Unspecified mood [affective] disorder: Secondary | ICD-10-CM | POA: Diagnosis not present

## 2017-04-02 MED FILL — TRINTELLIX 20 MG TABLET: 20 | 30 days supply | Qty: 30 | Fill #1

## 2017-04-03 ENCOUNTER — Other Ambulatory Visit: Payer: Self-pay | Admitting: Pharmacist

## 2017-04-03 MED ORDER — ADALIMUMAB 40 MG/0.8ML ~~LOC~~ AJKT: 0.8000 mL | AUTO-INJECTOR | SUBCUTANEOUS | 3 refills | Status: DC

## 2017-04-04 MED FILL — HUMIRA PEN 40 MG/0.8ML PNKT: 40 | 28 days supply | Qty: 2 | Fill #0

## 2017-04-04 MED FILL — ADDERALL XR 20 MG CAP SA: 20 | 30 days supply | Qty: 30 | Fill #0

## 2017-04-08 MED FILL — clonazePAM 1 MG TABS: 1 | 30 days supply | Qty: 30 | Fill #0

## 2017-04-11 MED FILL — LATUDA 80 MG TABLET: 80 | 30 days supply | Qty: 30 | Fill #2

## 2017-04-18 MED FILL — PROPRANOLOL 20 MG TABLET: 20 | 30 days supply | Qty: 180 | Fill #0

## 2017-04-29 MED FILL — traZODone HCL 50 MG TABS: 50 | 30 days supply | Qty: 60 | Fill #1

## 2017-04-30 MED FILL — SYNTHROID 100 MCG TABLET: 100 | 90 days supply | Qty: 90 | Fill #0

## 2017-05-01 MED FILL — FLUoxetine HCL 20 MG TABS: 20 | 27 days supply | Qty: 30 | Fill #0

## 2017-05-06 MED FILL — ADDERALL XR 20 MG CAP SA: 20 | 30 days supply | Qty: 30 | Fill #0

## 2017-05-12 MED FILL — HUMIRA PEN 40 MG/0.8ML PNKT: 40 | 28 days supply | Qty: 2 | Fill #1

## 2017-05-12 MED FILL — OMEPRAZOLE DR 20 MG CAPSULE: 20 | 90 days supply | Qty: 90 | Fill #2

## 2017-05-13 MED FILL — LATUDA 80 MG TABLET: 80 | 30 days supply | Qty: 30 | Fill #0

## 2017-05-19 MED FILL — PROPRANOLOL 20 MG TABLET: 20 | 30 days supply | Qty: 180 | Fill #1

## 2017-05-19 MED FILL — clonazePAM 1 MG TABS: 1 | 30 days supply | Qty: 30 | Fill #1

## 2017-05-23 MED FILL — FLUoxetine HCL 20 MG TABS: 20 | 30 days supply | Qty: 45 | Fill #0

## 2017-05-26 MED FILL — traZODone HCL 50 MG TABS: 50 | 30 days supply | Qty: 60 | Fill #0

## 2017-06-04 MED FILL — ADDERALL XR 20 MG CAP SA: 20 | 30 days supply | Qty: 30 | Fill #0

## 2017-06-04 MED FILL — ESTRADIOL 0.1 MG/GM CRM: 0.1 | 90 days supply | Qty: 43 | Fill #3

## 2017-06-05 DIAGNOSIS — H60333 Swimmer's ear, bilateral: Secondary | ICD-10-CM | POA: Diagnosis not present

## 2017-06-09 MED FILL — LATUDA 80 MG TABLET: 80 | 30 days supply | Qty: 30 | Fill #1

## 2017-06-09 MED FILL — HUMIRA PEN 40 MG/0.8ML PNKT: 40 | 28 days supply | Qty: 2 | Fill #2

## 2017-06-18 MED FILL — clonazePAM 1 MG TABS: 1 | 30 days supply | Qty: 30 | Fill #2

## 2017-06-18 MED FILL — PROPRANOLOL 20 MG TABLET: 20 | 30 days supply | Qty: 180 | Fill #2

## 2017-07-01 DIAGNOSIS — F39 Unspecified mood [affective] disorder: Secondary | ICD-10-CM | POA: Diagnosis not present

## 2017-07-01 DIAGNOSIS — F419 Anxiety disorder, unspecified: Secondary | ICD-10-CM | POA: Diagnosis not present

## 2017-07-01 DIAGNOSIS — F902 Attention-deficit hyperactivity disorder, combined type: Secondary | ICD-10-CM | POA: Diagnosis not present

## 2017-07-01 MED FILL — traZODone HCL 50 MG TABS: 50 | 30 days supply | Qty: 60 | Fill #1

## 2017-07-01 MED FILL — FLUoxetine HCL 20 MG TABS: 20 | 30 days supply | Qty: 90 | Fill #0

## 2017-07-03 MED FILL — LATUDA 80 MG TABLET: 80 | 30 days supply | Qty: 30 | Fill #0

## 2017-07-08 MED FILL — ADDERALL XR 20 MG CAP SA: 20 | 30 days supply | Qty: 30 | Fill #0

## 2017-07-16 MED FILL — HUMIRA PEN 40 MG/0.8ML PNKT: 40 | 28 days supply | Qty: 2 | Fill #3

## 2017-07-18 MED FILL — clonazePAM 1 MG TABS: 1 | 30 days supply | Qty: 30 | Fill #0

## 2017-07-21 MED FILL — PROPRANOLOL 20 MG TABLET: 20 | 30 days supply | Qty: 180 | Fill #3

## 2017-07-21 MED FILL — ELETRIPTAN HBR 20 MG TABLET: 20 | 30 days supply | Qty: 9 | Fill #0

## 2017-07-23 DIAGNOSIS — L409 Psoriasis, unspecified: Secondary | ICD-10-CM | POA: Diagnosis not present

## 2017-07-23 DIAGNOSIS — E663 Overweight: Secondary | ICD-10-CM | POA: Diagnosis not present

## 2017-07-23 DIAGNOSIS — M150 Primary generalized (osteo)arthritis: Secondary | ICD-10-CM | POA: Diagnosis not present

## 2017-07-23 DIAGNOSIS — Z6827 Body mass index (BMI) 27.0-27.9, adult: Secondary | ICD-10-CM | POA: Diagnosis not present

## 2017-07-23 DIAGNOSIS — L4059 Other psoriatic arthropathy: Secondary | ICD-10-CM | POA: Diagnosis not present

## 2017-08-04 MED FILL — SYNTHROID 100 MCG TABLET: 100 | 90 days supply | Qty: 90 | Fill #0

## 2017-08-04 MED FILL — FLUoxetine HCL 20 MG TABS: 20 | 30 days supply | Qty: 90 | Fill #1

## 2017-08-05 DIAGNOSIS — H6123 Impacted cerumen, bilateral: Secondary | ICD-10-CM | POA: Diagnosis not present

## 2017-08-12 MED FILL — ADDERALL XR 20 MG CAP SA: 20 | 30 days supply | Qty: 30 | Fill #0

## 2017-08-18 ENCOUNTER — Other Ambulatory Visit: Payer: Self-pay | Admitting: Internal Medicine

## 2017-08-18 MED FILL — OMEPRAZOLE 20 MG CAP: 20 | 90 days supply | Qty: 90 | Fill #3

## 2017-08-18 MED FILL — LATUDA 80 MG TABLET: 80 | 30 days supply | Qty: 30 | Fill #1

## 2017-08-19 ENCOUNTER — Other Ambulatory Visit: Payer: Self-pay | Admitting: Pharmacist

## 2017-08-19 MED ORDER — ADALIMUMAB 40 MG/0.8ML ~~LOC~~ AJKT: 0.8000 mL | AUTO-INJECTOR | SUBCUTANEOUS | 2 refills | Status: DC

## 2017-08-19 MED FILL — HUMIRA PEN 40 MG/0.8ML PNKT: 40 | 28 days supply | Qty: 2 | Fill #0

## 2017-08-19 NOTE — Telephone Encounter (Signed)
Called patient to schedule an appointment for the [facility]. I was unable to reach the patient so I left a HIPAA-compliant message requesting that the patient return my call.   

## 2017-08-22 MED FILL — PROPRANOLOL 20 MG TABLET: 20 | 30 days supply | Qty: 180 | Fill #0

## 2017-09-03 ENCOUNTER — Ambulatory Visit (INDEPENDENT_AMBULATORY_CARE_PROVIDER_SITE_OTHER): Payer: 59 | Admitting: Neurology

## 2017-09-03 ENCOUNTER — Encounter: Payer: Self-pay | Admitting: Neurology

## 2017-09-03 VITALS — BP 140/93 | HR 67 | Ht 66.0 in | Wt 162.8 lb

## 2017-09-03 DIAGNOSIS — I609 Nontraumatic subarachnoid hemorrhage, unspecified: Secondary | ICD-10-CM

## 2017-09-03 DIAGNOSIS — I7770 Dissection of unspecified artery: Secondary | ICD-10-CM | POA: Diagnosis not present

## 2017-09-03 DIAGNOSIS — I671 Cerebral aneurysm, nonruptured: Secondary | ICD-10-CM | POA: Diagnosis not present

## 2017-09-03 DIAGNOSIS — I7771 Dissection of carotid artery: Secondary | ICD-10-CM | POA: Diagnosis not present

## 2017-09-03 DIAGNOSIS — G939 Disorder of brain, unspecified: Secondary | ICD-10-CM | POA: Diagnosis not present

## 2017-09-03 DIAGNOSIS — R51 Headache: Secondary | ICD-10-CM

## 2017-09-03 DIAGNOSIS — G43109 Migraine with aura, not intractable, without status migrainosus: Secondary | ICD-10-CM

## 2017-09-03 DIAGNOSIS — R519 Headache, unspecified: Secondary | ICD-10-CM

## 2017-09-03 DIAGNOSIS — G9389 Other specified disorders of brain: Secondary | ICD-10-CM

## 2017-09-03 MED ORDER — INDOMETHACIN 25 MG PO CAPS: ORAL_CAPSULE | ORAL | 11 refills | Status: DC

## 2017-09-03 MED ORDER — SUMATRIPTAN SUCCINATE 3 MG/0.5ML ~~LOC~~ SOAJ: 3.0000 mg | Freq: Once | SUBCUTANEOUS | 11 refills | Status: DC | PRN

## 2017-09-03 MED FILL — INDOMETHACIN 25 MG CAPSULE: 25 | 11 days supply | Qty: 100 | Fill #0

## 2017-09-03 NOTE — Patient Instructions (Addendum)
Take Indomethacin 30-60 minutes before sex. Max 225mg  day Lab today MRI brain and blood vessels of the head and neck Imitrex injectable for severe headache  Sumatriptan injection What is this medicine? SUMATRIPTAN (soo ma TRIP tan) is used to treat migraines with or without aura. An aura is a strange feeling or visual disturbance that warns you of an attack. It is not used to prevent migraines. This medicine may be used for other purposes; ask your health care provider or pharmacist if you have questions. COMMON BRAND NAME(S): Alsuma, Imitrex, Imitrex Statdose, Sumavel DosePro System What should I tell my health care provider before I take this medicine? They need to know if you have any of these conditions: -circulation problems in fingers and toes -diabetes -heart disease -high blood pressure -high cholesterol -history of irregular heartbeat -history of stroke -kidney disease -liver disease -postmenopausal or surgical removal of uterus and ovaries -seizures -smoke tobacco -stomach or intestine problems -an unusual or allergic reaction to sumatriptan, other medicines, foods, dyes, or preservatives -pregnant or trying to get pregnant -breast-feeding How should I use this medicine? This medicine is for injection under the skin. Follow the directions on the prescription label. Only use this medicine at the first symptoms of a migraine. It is not for everyday use. If you are using an autoinjector, read the instruction leaflet carefully. A single injection is given just under the skin. Before you make an injection, clean and examine your skin. Do not inject at a place where the skin is damaged or infected. If your symptoms return you can use a second injection. If there is no improvement at all in your symptoms after the first injection, call your doctor or health care professional. Wait at least 1 hour between doses and do not use more than 6 mg as a single dose. Do not use more than 12 mg  total in any 24 hour period. Do not use your medicine more often than directed. Talk to your pediatrician regarding the use of this medicine in children. Special care may be needed. Overdosage: If you think you have taken too much of this medicine contact a poison control center or emergency room at once. NOTE: This medicine is only for you. Do not share this medicine with others. What if I miss a dose? This does not apply; this medicine is not for regular use. What may interact with this medicine? Do not take this medicine with any of the following medicines: -cocaine -ergot alkaloids like dihydroergotamine, ergonovine, ergotamine, methylergonovine -feverfew -MAOIs like Carbex, Eldepryl, Marplan, Nardil, and Parnate -other medicines for migraine headache like almotriptan, eletriptan, frovatriptan, naratriptan, rizatriptan, zolmitriptan -tryptophan This medicine may also interact with the following medications: -certain medicines for depression, anxiety, or psychotic disturbances This list may not describe all possible interactions. Give your health care provider a list of all the medicines, herbs, non-prescription drugs, or dietary supplements you use. Also tell them if you smoke, drink alcohol, or use illegal drugs. Some items may interact with your medicine. What should I watch for while using this medicine? Only take this medicine for a migraine headache. Take it if you get warning symptoms or at the start of a migraine attack. It is not for regular use to prevent migraine attacks. You may get drowsy or dizzy. Do not drive, use machinery, or do anything that needs mental alertness until you know how this medicine affects you. Do not stand or sit up quickly, especially if you are an older patient. This reduces  the risk of dizzy or fainting spells. Alcohol may interfere with the effect of this medicine. Avoid alcoholic drinks. Smoking cigarettes may increase the risk of heart-related side  effects from using this medicine. If you take migraine medicines for 10 or more days a month, your migraines may get worse. Keep a diary of headache days and medicine use. Contact your healthcare professional if your migraine attacks occur more frequently. What side effects may I notice from receiving this medicine? Side effects that you should report to your doctor or health care professional as soon as possible: -allergic reactions like skin rash, itching or hives, swelling of the face, lips, or tongue -bloody or watery diarrhea -hallucination, loss of contact with reality -pain, tingling, numbness in the face, hands, or feet -seizures -signs and symptoms of a blood clot such as breathing problems; changes in vision; chest pain; severe, sudden headache; pain, swelling, warmth in the leg; trouble speaking; sudden numbness or weakness of the face, arm, or leg -signs and symptoms of a dangerous change in heartbeat or heart rhythm like chest pain; dizziness; fast or irregular heartbeat; palpitations, feeling faint or lightheaded; falls; breathing problems -signs and symptoms of a stroke like changes in vision; confusion; trouble speaking or understanding; severe headaches; sudden numbness or weakness of the face, arm, or leg; trouble walking; dizziness; loss of balance or coordination -stomach pain Side effects that usually do not require medical attention (report to your doctor or health care professional if they continue or are bothersome): -changes in taste -facial flushing -headache -muscle cramps -muscle pain -nausea, vomiting -pain, redness, or irritation at site where injected -weak or tired This list may not describe all possible side effects. Call your doctor for medical advice about side effects. You may report side effects to FDA at 1-800-FDA-1088. Where should I keep my medicine? Keep out of the reach of children. Store at room temperature between 2 and 30 degrees C (36 and 86  degrees F). Protect from light. Throw away any unused medicine after the expiration date. Make sure you receive a puncture-resistant container to dispose of the needles and syringes once you have finished with them. Do not reuse these items. Return the container to your health care professional for proper disposal. Keep the autoinjector device. NOTE: This sheet is a summary. It may not cover all possible information. If you have questions about this medicine, talk to your doctor, pharmacist, or health care provider.  2018 Elsevier/Gold Standard (2015-12-28 12:41:27)  Indomethacin capsules What is this medicine? INDOMETHACIN (in doe METH a sin) is a non-steroidal anti-inflammatory drug (NSAID). It is used to reduce swelling and to treat pain. It may be used for painful joint and muscular problems such as arthritis, tendinitis, bursitis, and gout. This medicine may be used for other purposes; ask your health care provider or pharmacist if you have questions. COMMON BRAND NAME(S): Indocin, TIVORBEX What should I tell my health care provider before I take this medicine? They need to know if you have any of these conditions: -asthma, especially aspirin sensitive asthma -coronary artery bypass graft (CABG) surgery within the past 2 weeks -depression -drink more than 3 alcohol containing drinks a day -heart disease or circulation problems like heart failure or leg edema (fluid retention) -high blood pressure -kidney disease -liver disease -Parkinson's disease -seizures -stomach bleeding or ulcers -an unusual or allergic reaction to indomethacin, aspirin, other NSAIDs, other medicines, foods, dyes, or preservatives -pregnant or trying to get pregnant -breast-feeding How should I use this medicine?  Take this medicine by mouth with food and with a full glass of water. Follow the directions on the prescription label. Take your medicine at regular intervals. Do not take your medicine more often than  directed. Long-term, continuous use may increase the risk of heart attack or stroke. A special MedGuide will be given to you by the pharmacist with each prescription and refill. Be sure to read this information carefully each time. Talk to your pediatrician regarding the use of this medicine in children. Special care may be needed. While this drug may be prescribed for children as young as 15 years for selected conditions, precautions do apply. Elderly patients over 34 years old may have a stronger reaction and need a smaller dose. Overdosage: If you think you have taken too much of this medicine contact a poison control center or emergency room at once. NOTE: This medicine is only for you. Do not share this medicine with others. What if I miss a dose? If you miss a dose, take it as soon as you can. If it is almost time for your next dose, take only that dose. Do not take double or extra doses. What may interact with this medicine? Do not take this medicine with any of the following medications: -cidofovir -diflunisal -ketorolac -methotrexate -pemetrexed -triamterene This medicine may also interact with the following medications: -alcohol -antacids -aspirin and aspirin-like medicines -cyclosporine -digoxin -diuretics -lithium -medicines for diabetes -medicines for high blood pressure -medicines that affect platelets -medicines that treat or prevent blood clots like warfarin -NSAIDs, medicines for pain and inflammation, like ibuprofen or naproxen -probenecid -steroid medicines like prednisone or cortisone This list may not describe all possible interactions. Give your health care provider a list of all the medicines, herbs, non-prescription drugs, or dietary supplements you use. Also tell them if you smoke, drink alcohol, or use illegal drugs. Some items may interact with your medicine. What should I watch for while using this medicine? Tell your doctor or health care professional if  your pain does not get better. Talk to your doctor before taking another medicine for pain. Do not treat yourself. This medicine does not prevent heart attack or stroke. In fact, this medicine may increase the chance of a heart attack or stroke. The chance may increase with longer use of this medicine and in people who have heart disease. If you take aspirin to prevent heart attack or stroke, talk with your doctor or health care professional. Do not take medicines such as ibuprofen and naproxen with this medicine. Side effects such as stomach upset, nausea, or ulcers may be more likely to occur. Many medicines available without a prescription should not be taken with this medicine. This medicine can cause ulcers and bleeding in the stomach and intestines at any time during treatment. Do not smoke cigarettes or drink alcohol. These increase irritation to your stomach and can make it more susceptible to damage from this medicine. Ulcers and bleeding can happen without warning symptoms and can cause death. You may get drowsy or dizzy. Do not drive, use machinery, or do anything that needs mental alertness until you know how this medicine affects you. Do not stand or sit up quickly, especially if you are an older patient. This reduces the risk of dizzy or fainting spells. This medicine can cause you to bleed more easily. Try to avoid damage to your teeth and gums when you brush or floss your teeth. What side effects may I notice from receiving this medicine? Side  effects that you should report to your doctor or health care professional as soon as possible: -allergic reactions like skin rash, itching or hives, swelling of the face, lips, or tongue -difficulty breathing or wheezing -nausea, vomiting -signs and symptoms of bleeding such as bloody or black, tarry stools; red or dark-brown urine; spitting up blood or brown material that looks like coffee grounds; red spots on the skin; unusual bruising or bleeding  from the eye, gums, or nose -signs and symptoms of a blood clot such as changes in vision; chest pain; severe, sudden headache; trouble speaking; sudden numbness or weakness of the face, arm, or leg; trouble walking -unexplained weight gain or swelling -unusually weak or tired -yellowing of eyes or skin Side effects that usually do not require medical attention (report to your doctor or health care professional if they continue or are bothersome): -diarrhea -dizziness -headache -heartburn This list may not describe all possible side effects. Call your doctor for medical advice about side effects. You may report side effects to FDA at 1-800-FDA-1088. Where should I keep my medicine? Keep out of the reach of children. Store at room temperature between 15 and 30 degrees C (59 and 86 degrees F). Keep container tightly closed. Throw away any unused medicine after the expiration date. NOTE: This sheet is a summary. It may not cover all possible information. If you have questions about this medicine, talk to your doctor, pharmacist, or health care provider.  2018 Elsevier/Gold Standard (2013-04-13 15:28:44)

## 2017-09-03 NOTE — Progress Notes (Signed)
[facility]    Provider:  Dr Lucia Gaskins Referring Provider: Juluis Rainier, MD Primary Care Physician:  Juluis Rainier, MD  CC:  Migraines and headaches associated with sexual activity  HPI:  Kathryn Castaneda is a 56 y.o. female here as a referral from Dr. Zachery Dauer for headache. Past medical history arthritis, thyroid, EEG migraine, depression, anxiety. Migraines started in 2002 and they are infrequent, less than 1x a month and replax. She gets colored vision, pounding throbbing, light and sound sensitivity , infrequent nausea, takes the relpax and works. She starte dhaving orgasmic headaches 10 years ago, every time she has an orgasm she has an explosive headache. Propranolol helped in the past. They tried propranolol befpre sex and it didn't work and now on propranolol 60 mg twice a day not working. Sharp, severe, pounding, no other associated symptoms, resolved in 15-20 minutes.  No other focal neurologic deficits, associated symptoms, inciting events or modifiable factors.  Reviewed notes, labs and imaging from outside physicians, which showed:  Reviewed notes from primary care. Patient has a history of migraines and she is on propranolol. Also anxiety, insomnia, ADHD, hypothyroidism, GERD, depression, hyperlipidemia, remote history of hepatitis C, polyarticular psoriatic arthritis. She was referred here for migraines. Headaches have always been worse with orgasm and can last for 15 minutes afterwards. Recently her headaches have been worse at the same exact characteristic his previous headaches. She was put on propranolol in the past in one point she was up to 3 daily and that did help but then it went back down to 2 daily because the headaches were to control. She is taking propranolol an hour before intercourse but that has not really helped. Recent blood pressures have been slightly elevated, she is still using the Relpax frequently once or twice a month, she has no strokelike  symptoms or no unusual chest pain or shortness of breath. Exam was normal OD neurologic evaluation. Her propranolol was increased to 40 mg in the a.m. and 20 mg in the PT in March 2018.  Labs include CMP normal with creatinine 0.8 BUN 18 and August 2017, TSH normal, LDL elevated at 140  Review of Systems: Patient complains of symptoms per HPI as well as the following symptoms: Headache she cannot sleep. Pertinent negatives and positives per HPI. All others negative.   Social History   Social History  . Marital status: Married    Spouse name: N/A  . Number of children: N/A  . Years of education: N/A   Occupational History  . Not on file.   Social History Main Topics  . Smoking status: Never Smoker  . Smokeless tobacco: Not on file  . Alcohol use Yes     Comment: occ  . Drug use: No  . Sexual activity: Not on file   Other Topics Concern  . Not on file   Social History Narrative  . No narrative on file    Family History  Problem Relation Age of Onset  . Stroke Neg Hx     Past Medical History:  Diagnosis Date  . Acid reflux   . ADHD (attention deficit hyperactivity disorder)   . Anxiety   . Arthritis   . Depression   . Headache   . Hepatitis C    had treatment 1990's  . Hyperlipemia   . Hypothyroidism   . Thyroid disease     Past Surgical History:  Procedure Laterality Date  . ABDOMINAL HYSTERECTOMY    . CESAREAN SECTION    .  JOINT REPLACEMENT Right   . ROTATOR CUFF REPAIR Right   . SHOULDER ARTHROSCOPY WITH BICEPSTENOTOMY Left 02/20/2016   Procedure: SHOULDER ARTHROSCOPY SUPSCAPULARIS REPAIR WITH BICEPS TENOTOMY OPEN TENODESIS;  Surgeon: Jones Broom, MD;  Location: [facility];  Service: Orthopedics;  Laterality: Left;    Current Outpatient Prescriptions  Medication Sig Dispense Refill  . Adalimumab (HUMIRA PEN) 40 MG/0.8ML PNKT Inject 0.8 mLs into the skin every 14 (fourteen) days. 6 each 2  . amphetamine-dextroamphetamine (ADDERALL  XR) 20 MG 24 hr capsule Take 20 mg by mouth daily.    . clonazePAM (KLONOPIN) 1 MG tablet Take 1 mg by mouth daily as needed for anxiety (sleep).     . Eletriptan Hydrobromide (RELPAX PO) Take 1 capsule by mouth daily as needed.    Marland Kitchen levothyroxine (SYNTHROID, LEVOTHROID) 100 MCG tablet Take 100 mcg by mouth daily before breakfast.    . lurasidone (LATUDA) 80 MG TABS tablet Take 80 mg by mouth daily with breakfast.    . Multiple Vitamin (MULTIVITAMIN WITH MINERALS) TABS tablet Take 1 tablet by mouth daily.    . naproxen (NAPROSYN) 500 MG tablet Take 1 tablet (500 mg total) by mouth 2 (two) times daily with a meal. 30 tablet 0  . omeprazole (PRILOSEC) 20 MG capsule Take 20 mg by mouth daily.    Marland Kitchen PARoxetine (PAXIL) 30 MG tablet Take 60 mg by mouth daily.    . propranolol (INDERAL) 20 MG tablet Take 60 mg by mouth 2 (two) times daily.     . indomethacin (INDOCIN) 25 MG capsule Take 30-60 minutes before sex. Max  daily 100 capsule 11  . SUMAtriptan Succinate (ZEMBRACE SYMTOUCH) 3 MG/0.5ML SOAJ Inject 3 mg into the skin once as needed. May repeat in 15 minutes. If symptoms persist, repeat in 2 hours once. 8 pen 11   No current facility-administered medications for this visit.     Allergies as of 09/03/2017 - Review Complete 09/03/2017  Allergen Reaction Noted  . Ultram [tramadol hcl] Other (See Comments) 12/17/2015  . Lactose intolerance (gi) Diarrhea 03/02/2014    Vitals: BP (!) 140/93   Pulse 67   Ht  (1.676 m)   Wt 162 lb 12.8 oz (73.8 kg)   BMI 26.28 kg/m  Last Weight:  Wt Readings from Last 1 Encounters:  09/03/17 162 lb 12.8 oz (73.8 kg)   Last Height:   Ht Readings from Last 1 Encounters:  09/03/17  (1.676 m)   Physical exam: Exam: Gen: NAD, conversant, well nourised, obese, well groomed                     CV: RRR, no MRG. No Carotid Bruits. No peripheral edema, warm, nontender Eyes: Conjunctivae clear without exudates or hemorrhage  Neuro: Detailed  Neurologic Exam  Speech:    Speech is normal; fluent and spontaneous with normal comprehension.  Cognition:    The patient is oriented to person, place, and time;     recent and remote memory intact;     language fluent;     normal attention, concentration,     fund of knowledge Cranial Nerves:    The pupils are equal, round, and reactive to light. The fundi are normal and spontaneous venous pulsations are present. Visual fields are full to finger confrontation. Extraocular movements are intact. Trigeminal sensation is intact and the muscles of mastication are normal. The face is symmetric. The palate elevates in the midline. Hearing intact. Voice is normal.  Shoulder shrug is normal. The tongue has normal motion without fasciculations.   Coordination:    Normal finger to nose and heel to shin. Normal rapid alternating movements.   Gait:    Heel-toe and tandem gait are normal.   Motor Observation:    No asymmetry, no atrophy, and no involuntary movements noted. Tone:    Normal muscle tone.    Posture:    Posture is normal. normal erect    Strength:    Strength is V/V in the upper and lower limbs.      Sensation: intact to LT     Reflex Exam:  DTR's:    Deep tendon reflexes in the upper and lower extremities are normal bilaterally.   Toes:    The toes are downgoing bilaterally.   Clonus:    Clonus is absent.       Assessment/Plan:  Patient with severe, explosive headache during sex. These are usually benign however need to rule out subarachnoid hemorrhage, space-occupying occipital mass, chiari, aneurysm, cerebral arterial dissection, carotid dissection. Indomethacin 30-60 minutes before sexual activity.   Orders Placed This Encounter  Procedures  . MR BRAIN W WO CONTRAST  . MR MRA HEAD WO CONTRAST  . MR MRA NECK W WO CONTRAST  . Basic Metabolic Panel     Discussed: To prevent or relieve headaches, try the following: Cool Compress. Lie down and place a cool  compress on your head.  Avoid headache triggers. If certain foods or odors seem to have triggered your migraines in the past, avoid them. A headache diary might help you identify triggers.  Include physical activity in your daily routine. Try a daily walk or other moderate aerobic exercise.  Manage stress. Find healthy ways to cope with the stressors, such as delegating tasks on your to-do list.  Practice relaxation techniques. Try deep breathing, yoga, massage and visualization.  Eat regularly. Eating regularly scheduled meals and maintaining a healthy diet might help prevent headaches. Also, drink plenty of fluids.  Follow a regular sleep schedule. Sleep deprivation might contribute to headaches Consider biofeedback. With this mind-body technique, you learn to control certain bodily functions - such as muscle tension, heart rate and blood pressure - to prevent headaches or reduce headache pain.    Proceed to emergency room if you experience new or worsening symptoms or symptoms do not resolve, if you have new neurologic symptoms or if headache is severe, or for any concerning symptom.   Provided education and documentation from American headache Society toolbox including articles on: chronic migraine medication overuse headache, chronic migraines, prevention of migraines, behavioral and other nonpharmacologic treatments for headache.  Naomie Dean, MD  [facility] [address] [place], [place] [address]  Phone [phone] Fax [phone]

## 2017-09-04 ENCOUNTER — Other Ambulatory Visit: Payer: Self-pay | Admitting: Neurology

## 2017-09-04 ENCOUNTER — Ambulatory Visit (HOSPITAL_BASED_OUTPATIENT_CLINIC_OR_DEPARTMENT_OTHER): Payer: Self-pay | Admitting: Pharmacist

## 2017-09-04 ENCOUNTER — Telehealth: Payer: Self-pay | Admitting: Neurology

## 2017-09-04 DIAGNOSIS — Z7189 Other specified counseling: Secondary | ICD-10-CM

## 2017-09-04 LAB — BASIC METABOLIC PANEL
BUN / CREAT RATIO: 25 — AB (ref 9–23)
BUN: 18 mg/dL (ref 6–24)
CO2: 24 mmol/L (ref 20–29)
Calcium: 9.3 mg/dL (ref 8.7–10.2)
Chloride: 99 mmol/L (ref 96–106)
Creatinine, Ser: 0.72 mg/dL (ref 0.57–1.00)
GFR calc non Af Amer: 94 mL/min/{1.73_m2} (ref >59)
GFR, EST AFRICAN AMERICAN: 108 mL/min/{1.73_m2} (ref >59)
Glucose: 79 mg/dL (ref 65–99)
Potassium: 4.6 mmol/L (ref 3.5–5.2)
Sodium: 139 mmol/L (ref 134–144)

## 2017-09-04 MED ORDER — ALPRAZOLAM 0.5 MG PO TABS: ORAL_TABLET | ORAL | 0 refills | Status: DC

## 2017-09-04 MED FILL — clonazePAM 1 MG TABS: 1 | 30 days supply | Qty: 30 | Fill #1

## 2017-09-04 MED FILL — traZODone HCL 50 MG TABS: 50 | 30 days supply | Qty: 60 | Fill #0

## 2017-09-04 MED FILL — FLUoxetine HCL 20 MG TABS: 20 | 30 days supply | Qty: 90 | Fill #2

## 2017-09-04 MED FILL — ALPRAZolam 0.5 MG TABS: 0.5 | 1 days supply | Qty: 3 | Fill #0

## 2017-09-04 NOTE — Telephone Encounter (Signed)
-----   Message from Anson Fret, MD sent at 09/04/2017  8:37 AM EDT ----- Slightly dehydrated otherwise labs normal increase fluids thanks

## 2017-09-04 NOTE — Telephone Encounter (Signed)
Sent to [facility] outpt pharm  Xanax 3 tabs.

## 2017-09-04 NOTE — Progress Notes (Signed)
   S: Patient presents today to the [facility] for review of her speciality medication  Patient is currently taking Humira for rheumatoid arthritis. Patient is managed by Dr. Dierdre Forth for this. She reports that Humira is still working very well and she is very happy with it  Adherence: denies any missed doses.  Dosing:  Rheumatoid arthritis: SubQ: 40 mg every other week (may continue methotrexate, other nonbiologic DMARDS, corticosteroids, NSAIDs, and/or analgesics); patients not taking concomitant methotrexate may increase dose to 40 mg every week  Drug-drug interactions: none  Screening: TB test: completed per patient Hepatitis: completed per patient  Monitoring: S/sx of infection: denies CBC: done 3 weeks ago - WNL per patient. Followed by the rheumatology office. S/sx of hypersensitivity: denies S/sx of malignancy: denies S/sx of heart failure: denies    O:     Lab Results  Component Value Date   WBC 2.9 (L) 05/31/2013   HGB 11.6 (L) 05/31/2013   HCT 34.6 (L) 05/31/2013   MCV 90.3 05/31/2013   PLT 154 05/31/2013      Chemistry      Component Value Date/Time   NA 139 09/03/2017 1105   K 4.6 09/03/2017 1105   CL 99 09/03/2017 1105   CO2 24 09/03/2017 1105   BUN 18 09/03/2017 1105   CREATININE 0.72 09/03/2017 1105      Component Value Date/Time   CALCIUM 9.3 09/03/2017 1105   ALKPHOS 69 05/31/2013 0530   AST 17 05/31/2013 0530   ALT 6 05/31/2013 0530   BILITOT 0.2 (L) 05/31/2013 0530       A/P: 1. Medication review: patient on Humira for RA, tolerating it well with no adverse effects and improved control of RA. Reviewed the medication with her, including the following: Humira is a TNF blocking agent indicated for ankylosing spondylitis, Crohn's disease, Hidradenitis suppurativa, psoriatic arthritis, plaque psoriasis, ulcerative colitis, and uveitis. The most common adverse effects are infections, headache,  and injection site reactions. There is the possibility of an increased risk of malignancy but it is not well understood if this increased risk is due to there medication or the disease state. There are rare cases of pancytopenia and aplastic anemia. No recommendations for any changes. Fax sent to Dr. Shawnee Knapp office for most recent office visit and lab information.   Alvino Blood, PharmD, BCPS, BCACP, CPP Clinical Pharmacist Practitioner  [facility] [phone]

## 2017-09-04 NOTE — Telephone Encounter (Signed)
Patient is scheduled to have the images done on Tuesday 09/16/17 at the [facility] mobile unit. She stated that she is slightly claustrophic and will need something to help her.

## 2017-09-04 NOTE — Telephone Encounter (Signed)
Spoke to pt and let her know that xanax called to [facility] outpt pharm for her when has MRI.  She verbalized understanding and appreciation.

## 2017-09-04 NOTE — Telephone Encounter (Signed)
Called and went over lab work with the patient. Instructed her to increase her fluid intake. Pt verbalized understanding and had no further questions.

## 2017-09-05 ENCOUNTER — Telehealth: Payer: Self-pay | Admitting: Neurology

## 2017-09-05 NOTE — Telephone Encounter (Signed)
Rep Olegario Messier has called re: a prior authorization she is working on for [employer] for pt/  She is asking for tired and failed medications for this pt.  She is asking for a call at [phone]

## 2017-09-05 NOTE — Telephone Encounter (Signed)
Returned [employer], patient assistance program and answered her questions referencing office note. She verbalized understanding, appreciation.

## 2017-09-16 ENCOUNTER — Ambulatory Visit (INDEPENDENT_AMBULATORY_CARE_PROVIDER_SITE_OTHER): Payer: 59

## 2017-09-16 DIAGNOSIS — I671 Cerebral aneurysm, nonruptured: Secondary | ICD-10-CM | POA: Diagnosis not present

## 2017-09-16 DIAGNOSIS — G9389 Other specified disorders of brain: Secondary | ICD-10-CM

## 2017-09-16 DIAGNOSIS — G43109 Migraine with aura, not intractable, without status migrainosus: Secondary | ICD-10-CM | POA: Diagnosis not present

## 2017-09-16 DIAGNOSIS — R51 Headache: Secondary | ICD-10-CM

## 2017-09-16 DIAGNOSIS — I7770 Dissection of unspecified artery: Secondary | ICD-10-CM

## 2017-09-16 DIAGNOSIS — G939 Disorder of brain, unspecified: Secondary | ICD-10-CM | POA: Diagnosis not present

## 2017-09-16 DIAGNOSIS — R519 Headache, unspecified: Secondary | ICD-10-CM

## 2017-09-16 DIAGNOSIS — I609 Nontraumatic subarachnoid hemorrhage, unspecified: Secondary | ICD-10-CM | POA: Diagnosis not present

## 2017-09-16 DIAGNOSIS — I7771 Dissection of carotid artery: Secondary | ICD-10-CM

## 2017-09-16 MED ORDER — GADOPENTETATE DIMEGLUMINE 469.01 MG/ML IV SOLN: 20.0000 mL | Freq: Once | INTRAVENOUS | Status: AC | PRN

## 2017-09-17 MED FILL — HUMIRA PEN 40 MG/0.8ML PNKT: 40 | 28 days supply | Qty: 2 | Fill #1

## 2017-09-17 MED FILL — LATUDA 80 MG TABLET: 80 | 30 days supply | Qty: 30 | Fill #2

## 2017-09-17 MED FILL — ADDERALL XR 20 MG CAP SA: 20 | 30 days supply | Qty: 30 | Fill #0

## 2017-09-22 ENCOUNTER — Telehealth: Payer: Self-pay | Admitting: *Deleted

## 2017-09-22 DIAGNOSIS — H60333 Swimmer's ear, bilateral: Secondary | ICD-10-CM | POA: Diagnosis not present

## 2017-09-22 NOTE — Telephone Encounter (Signed)
Called patient, left detailed VM on cell (ok per DPR) informing her that MRI brain, MRA head and neck were normal per Dr. Lucia Gaskins. Told her to call if she has any further questions.

## 2017-09-22 NOTE — Telephone Encounter (Signed)
-----   Message from Anson Fret, MD sent at 09/18/2017  9:01 AM EDT ----- MRI brain, MRA head and neck all normal thanks

## 2017-09-26 MED FILL — ESTRADIOL 0.1 MG/GM CREA: 0.1 | 90 days supply | Qty: 43 | Fill #4

## 2017-09-29 DIAGNOSIS — F902 Attention-deficit hyperactivity disorder, combined type: Secondary | ICD-10-CM | POA: Diagnosis not present

## 2017-09-29 DIAGNOSIS — F39 Unspecified mood [affective] disorder: Secondary | ICD-10-CM | POA: Diagnosis not present

## 2017-09-29 DIAGNOSIS — F419 Anxiety disorder, unspecified: Secondary | ICD-10-CM | POA: Diagnosis not present

## 2017-09-29 MED FILL — traZODone HCL 50 MG TABS: 50 | 30 days supply | Qty: 60 | Fill #0

## 2017-09-29 MED FILL — DULoxetine HCL 20 MG CPEP: 20 | 30 days supply | Qty: 60 | Fill #0

## 2017-10-07 ENCOUNTER — Telehealth: Payer: Self-pay | Admitting: Neurology

## 2017-10-07 ENCOUNTER — Other Ambulatory Visit: Payer: Self-pay | Admitting: Neurology

## 2017-10-07 MED ORDER — PROPRANOLOL HCL 10 MG PO TABS: 10.0000 mg | ORAL_TABLET | Freq: Two times a day (BID) | ORAL | 11 refills | Status: DC

## 2017-10-07 MED FILL — PROPRANOLOL 10 MG TABLET: 10 | 30 days supply | Qty: 60 | Fill #0

## 2017-10-07 NOTE — Telephone Encounter (Signed)
Called patient, she states that she was down to 10 mg of Propranolol and had been on it for about a week prior to running out of medication. She thinks the symptoms started when she was taking the 10 mg. She reports shakes & heart palpitations. She would like a refill to continue medication for now until she can wean off. I told her I would d/w Dr. Lucia Gaskins and call her back.

## 2017-10-07 NOTE — Telephone Encounter (Signed)
Pt states Dr Lucia Gaskins is wenning her off of propranolol (INDERAL) 20 MG tablet but she is experiencing side effects and is now out of it for about 5 days and her symptoms are getting worse.  Pt is asking for a prescription for at least 10 mg of the propranolol.  Please call, please leave message if not avail.  Pt wants to use  [facility] - [place], [place] - [address] [phone] (Phone) [phone] (Fax)

## 2017-10-07 NOTE — Telephone Encounter (Signed)
I called in a prescription thanks

## 2017-10-07 NOTE — Telephone Encounter (Signed)
Called pt and LVM (ok per DPR) informing her that Dr. Lucia Gaskins sent in a prescription for Propanolol 10 mg BID. Told to call if she has any questions.

## 2017-10-10 MED FILL — clonazePAM 1 MG TABS: 1 | 30 days supply | Qty: 30 | Fill #2

## 2017-10-13 MED FILL — HUMIRA PEN 40 MG/0.8ML PNKT: 40 | 28 days supply | Qty: 2 | Fill #2

## 2017-10-21 MED FILL — ADDERALL XR 20 MG CAP SA: 20 | 30 days supply | Qty: 30 | Fill #0

## 2017-10-28 MED FILL — LATUDA 80 MG TABLET: 80 | 30 days supply | Qty: 30 | Fill #0

## 2017-10-28 MED FILL — DULoxetine HCL 20 MG CPEP: 20 | 30 days supply | Qty: 60 | Fill #1

## 2017-11-06 MED FILL — SYNTHROID 100 MCG TABLET: 100 | 90 days supply | Qty: 90 | Fill #1

## 2017-11-18 MED FILL — ELETRIPTAN HBR 20 MG TABLET: 20 | 30 days supply | Qty: 9 | Fill #1

## 2017-11-19 MED FILL — OMEPRAZOLE 20 MG CAP: 20 | 30 days supply | Qty: 30 | Fill #0

## 2017-11-21 MED FILL — ADDERALL XR 20 MG CAP SA: 20 | 30 days supply | Qty: 30 | Fill #0

## 2017-11-24 MED FILL — HUMIRA PEN 40 MG/0.8ML PNKT: 40 | 28 days supply | Qty: 2 | Fill #3

## 2017-11-24 MED FILL — LATUDA 80 MG TABLET: 80 | 30 days supply | Qty: 30 | Fill #1

## 2017-11-24 MED FILL — PROPRANOLOL HCL 10 MG TABS: 10 | 30 days supply | Qty: 60 | Fill #1

## 2017-12-04 MED FILL — DULoxetine HCL 20 MG CPEP: 20 | 30 days supply | Qty: 60 | Fill #0

## 2017-12-08 DIAGNOSIS — F902 Attention-deficit hyperactivity disorder, combined type: Secondary | ICD-10-CM | POA: Diagnosis not present

## 2017-12-08 DIAGNOSIS — F419 Anxiety disorder, unspecified: Secondary | ICD-10-CM | POA: Diagnosis not present

## 2017-12-08 DIAGNOSIS — F39 Unspecified mood [affective] disorder: Secondary | ICD-10-CM | POA: Diagnosis not present

## 2017-12-08 MED FILL — clonazePAM 1 MG TABS: 1 | 30 days supply | Qty: 30 | Fill #0

## 2017-12-08 MED FILL — traZODone HCL 50 MG TABS: 50 | 30 days supply | Qty: 60 | Fill #1

## 2017-12-15 MED FILL — HUMIRA PEN 40 MG/0.8ML PNKT: 40 | 28 days supply | Qty: 2 | Fill #4

## 2017-12-23 MED FILL — LATUDA 80 MG TABLET: 80 | 30 days supply | Qty: 30 | Fill #2

## 2017-12-24 MED FILL — ADDERALL XR 20 MG CAP SA: 20 | 30 days supply | Qty: 30 | Fill #0

## 2018-01-05 MED FILL — DULoxetine HCL 20 MG CPEP: 20 | 30 days supply | Qty: 60 | Fill #0

## 2018-01-13 DIAGNOSIS — F419 Anxiety disorder, unspecified: Secondary | ICD-10-CM | POA: Diagnosis not present

## 2018-01-13 DIAGNOSIS — E039 Hypothyroidism, unspecified: Secondary | ICD-10-CM | POA: Diagnosis not present

## 2018-01-13 DIAGNOSIS — R030 Elevated blood-pressure reading, without diagnosis of hypertension: Secondary | ICD-10-CM | POA: Diagnosis not present

## 2018-01-13 DIAGNOSIS — F339 Major depressive disorder, recurrent, unspecified: Secondary | ICD-10-CM | POA: Diagnosis not present

## 2018-01-13 DIAGNOSIS — E7800 Pure hypercholesterolemia, unspecified: Secondary | ICD-10-CM | POA: Diagnosis not present

## 2018-01-13 DIAGNOSIS — F909 Attention-deficit hyperactivity disorder, unspecified type: Secondary | ICD-10-CM | POA: Diagnosis not present

## 2018-01-15 ENCOUNTER — Other Ambulatory Visit: Payer: Self-pay | Admitting: Neurology

## 2018-01-15 ENCOUNTER — Telehealth: Payer: Self-pay | Admitting: Neurology

## 2018-01-15 MED ORDER — PROPRANOLOL HCL 80 MG PO TABS: 80.0000 mg | ORAL_TABLET | Freq: Two times a day (BID) | ORAL | 6 refills | Status: DC

## 2018-01-15 MED FILL — PROPRANOLOL HCL 80 MG TAB: 80 | 30 days supply | Qty: 60 | Fill #0

## 2018-01-15 NOTE — Telephone Encounter (Signed)
Patient stated she weaned off Propranolol, totally off in November. At one point she was on 60 mg BID. She said she has had issues with headaches and blood pressure since stopping Propranolol. She is on Indomethacin already.

## 2018-01-15 NOTE — Telephone Encounter (Signed)
We can do a few things. We can increase her propranolol (what dose is she on now?). We can also try Indomethacin 30-60 minutes before sex. I may suggest doing both if she is willing. Please discuss with her thanks.

## 2018-01-15 NOTE — Telephone Encounter (Signed)
Kathryn Castaneda with Dr. Francine Graven called relaying a messages stating "Pt has a history of migraines and has been treated with Propranolol but is now on Sumatriptan. Pts BP has been elevated and has continued to have migraines. Is requesting going back to propranolol or a new medication. Dr.Banes would love to speak with Dr. Lucia Gaskins if she has any question or concerns at [phone]"

## 2018-01-15 NOTE — Telephone Encounter (Signed)
Spoke with patient. She is aware that Dr. Lucia Gaskins would like for her to restart Propranolol at a higher dose, 80 mg BID. The prescription has been called in already to [facility]. She is aware of potential symptoms to watch for re: low blood pressure and heart rate including dizziness, feeling like she will pass out, chest pain. She verbalized understanding and will monitor her blood pressure and heart rate daily.

## 2018-01-15 NOTE — Telephone Encounter (Signed)
Restart propranolol at a higher dose will call it in thanks

## 2018-01-19 MED FILL — LATUDA 80 MG TABLET: 80 | 30 days supply | Qty: 30 | Fill #0

## 2018-01-19 MED FILL — clonazePAM 1 MG TABS: 1 | 30 days supply | Qty: 30 | Fill #1

## 2018-01-19 MED FILL — HUMIRA PEN 40 MG/0.8ML PNKT: 40 | 28 days supply | Qty: 2 | Fill #5

## 2018-01-21 MED FILL — ADDERALL XR 20 MG CAP SA: 20 | 30 days supply | Qty: 30 | Fill #0

## 2018-01-27 MED FILL — OMEPRAZOLE 20 MG CAP: 20 | 90 days supply | Qty: 90 | Fill #0

## 2018-02-02 MED FILL — DULoxetine HCL 20 MG CPEP: 20 | 30 days supply | Qty: 60 | Fill #1

## 2018-02-16 ENCOUNTER — Other Ambulatory Visit: Payer: Self-pay | Admitting: Family Medicine

## 2018-02-16 ENCOUNTER — Other Ambulatory Visit: Payer: Self-pay | Admitting: Internal Medicine

## 2018-02-16 DIAGNOSIS — Z1231 Encounter for screening mammogram for malignant neoplasm of breast: Secondary | ICD-10-CM

## 2018-02-17 MED FILL — AMOXICILLIN 500 MG CAPSULE: 500 | 2 days supply | Qty: 8 | Fill #0

## 2018-02-17 MED FILL — LATUDA 80 MG TABLET: 80 | 30 days supply | Qty: 30 | Fill #1

## 2018-02-17 MED FILL — ESTRADIOL 0.1 MG/GM CREA: 0.1 | 90 days supply | Qty: 43 | Fill #0

## 2018-02-17 MED FILL — SYNTHROID 100 MCG TABLET: 100 | 90 days supply | Qty: 90 | Fill #0

## 2018-02-23 MED FILL — HUMIRA PEN 40 MG/0.8ML PNKT: 40 | 28 days supply | Qty: 2 | Fill #6

## 2018-02-23 MED FILL — ELETRIPTAN HBR 20 MG TABLET: 20 | 30 days supply | Qty: 6 | Fill #2

## 2018-02-26 MED FILL — DULoxetine HCL 60 MG CPEP: 60 | 30 days supply | Qty: 30 | Fill #0

## 2018-03-03 ENCOUNTER — Ambulatory Visit: Payer: Self-pay

## 2018-03-03 MED FILL — ADDERALL XR 20 MG CAP SA: 20 | 30 days supply | Qty: 30 | Fill #0

## 2018-03-10 DIAGNOSIS — H60333 Swimmer's ear, bilateral: Secondary | ICD-10-CM | POA: Diagnosis not present

## 2018-03-10 DIAGNOSIS — H6123 Impacted cerumen, bilateral: Secondary | ICD-10-CM | POA: Diagnosis not present

## 2018-03-13 MED FILL — PROPRANOLOL HCL 80 MG TAB: 80 | 30 days supply | Qty: 60 | Fill #1

## 2018-03-13 MED FILL — INDOMETHACIN 25 MG CAPSULE: 25 | 11 days supply | Qty: 100 | Fill #1

## 2018-03-13 MED FILL — clonazePAM 1 MG TABS: 1 | 30 days supply | Qty: 30 | Fill #2

## 2018-03-24 DIAGNOSIS — F902 Attention-deficit hyperactivity disorder, combined type: Secondary | ICD-10-CM | POA: Diagnosis not present

## 2018-03-24 DIAGNOSIS — F419 Anxiety disorder, unspecified: Secondary | ICD-10-CM | POA: Diagnosis not present

## 2018-03-24 DIAGNOSIS — F3131 Bipolar disorder, current episode depressed, mild: Secondary | ICD-10-CM | POA: Diagnosis not present

## 2018-03-25 MED FILL — DULoxetine HCL 60 MG CPEP: 60 | 30 days supply | Qty: 30 | Fill #0

## 2018-03-25 MED FILL — LATUDA 80 MG TABLET: 80 | 30 days supply | Qty: 30 | Fill #0

## 2018-03-26 MED FILL — traZODone HCL 50 MG TABS: 50 | 30 days supply | Qty: 60 | Fill #2

## 2018-03-31 MED FILL — ADDERALL XR 20 MG CAP SA: 20 | 30 days supply | Qty: 30 | Fill #0

## 2018-03-31 MED FILL — HUMIRA PEN 40 MG/0.8ML PNKT: 40 | 28 days supply | Qty: 2 | Fill #7

## 2018-04-03 DIAGNOSIS — F339 Major depressive disorder, recurrent, unspecified: Secondary | ICD-10-CM | POA: Diagnosis not present

## 2018-04-03 DIAGNOSIS — G43909 Migraine, unspecified, not intractable, without status migrainosus: Secondary | ICD-10-CM | POA: Diagnosis not present

## 2018-04-03 DIAGNOSIS — Z8619 Personal history of other infectious and parasitic diseases: Secondary | ICD-10-CM | POA: Diagnosis not present

## 2018-04-03 DIAGNOSIS — G4700 Insomnia, unspecified: Secondary | ICD-10-CM | POA: Diagnosis not present

## 2018-04-03 DIAGNOSIS — K219 Gastro-esophageal reflux disease without esophagitis: Secondary | ICD-10-CM | POA: Diagnosis not present

## 2018-04-03 DIAGNOSIS — E039 Hypothyroidism, unspecified: Secondary | ICD-10-CM | POA: Diagnosis not present

## 2018-04-03 DIAGNOSIS — F909 Attention-deficit hyperactivity disorder, unspecified type: Secondary | ICD-10-CM | POA: Diagnosis not present

## 2018-04-03 DIAGNOSIS — E7800 Pure hypercholesterolemia, unspecified: Secondary | ICD-10-CM | POA: Diagnosis not present

## 2018-04-03 DIAGNOSIS — Z0000 Encounter for general adult medical examination without abnormal findings: Secondary | ICD-10-CM | POA: Diagnosis not present

## 2018-04-09 ENCOUNTER — Other Ambulatory Visit: Payer: Self-pay | Admitting: Pharmacist

## 2018-04-09 DIAGNOSIS — L4059 Other psoriatic arthropathy: Secondary | ICD-10-CM | POA: Diagnosis not present

## 2018-04-09 DIAGNOSIS — E663 Overweight: Secondary | ICD-10-CM | POA: Diagnosis not present

## 2018-04-09 DIAGNOSIS — Z6826 Body mass index (BMI) 26.0-26.9, adult: Secondary | ICD-10-CM | POA: Diagnosis not present

## 2018-04-09 DIAGNOSIS — M150 Primary generalized (osteo)arthritis: Secondary | ICD-10-CM | POA: Diagnosis not present

## 2018-04-09 DIAGNOSIS — L409 Psoriasis, unspecified: Secondary | ICD-10-CM | POA: Diagnosis not present

## 2018-04-09 MED ORDER — ADALIMUMAB 40 MG/0.4ML ~~LOC~~ AJKT: 1.0000 | AUTO-INJECTOR | SUBCUTANEOUS | 5 refills | Status: DC

## 2018-04-24 MED FILL — HUMIRA PEN 40 MG/0.4ML PNKT: 40 | 28 days supply | Qty: 2 | Fill #0

## 2018-04-28 MED FILL — DULoxetine HCL 60 MG CPEP: 60 | 30 days supply | Qty: 30 | Fill #1

## 2018-04-28 MED FILL — OMEPRAZOLE 20 MG CAP: 20 | 90 days supply | Qty: 90 | Fill #1

## 2018-04-30 MED FILL — clonazePAM 1 MG TABS: 1 | 30 days supply | Qty: 30 | Fill #0

## 2018-05-01 DIAGNOSIS — F339 Major depressive disorder, recurrent, unspecified: Secondary | ICD-10-CM | POA: Diagnosis not present

## 2018-05-01 DIAGNOSIS — F909 Attention-deficit hyperactivity disorder, unspecified type: Secondary | ICD-10-CM | POA: Diagnosis not present

## 2018-05-01 DIAGNOSIS — E7800 Pure hypercholesterolemia, unspecified: Secondary | ICD-10-CM | POA: Diagnosis not present

## 2018-05-01 DIAGNOSIS — E039 Hypothyroidism, unspecified: Secondary | ICD-10-CM | POA: Diagnosis not present

## 2018-05-01 DIAGNOSIS — G43909 Migraine, unspecified, not intractable, without status migrainosus: Secondary | ICD-10-CM | POA: Diagnosis not present

## 2018-05-01 DIAGNOSIS — K219 Gastro-esophageal reflux disease without esophagitis: Secondary | ICD-10-CM | POA: Diagnosis not present

## 2018-05-01 DIAGNOSIS — Z8619 Personal history of other infectious and parasitic diseases: Secondary | ICD-10-CM | POA: Diagnosis not present

## 2018-05-01 DIAGNOSIS — Z0000 Encounter for general adult medical examination without abnormal findings: Secondary | ICD-10-CM | POA: Diagnosis not present

## 2018-05-01 DIAGNOSIS — G4700 Insomnia, unspecified: Secondary | ICD-10-CM | POA: Diagnosis not present

## 2018-05-04 MED FILL — ESTRADIOL 0.1 MG/GM CREA: 0.1 | 90 days supply | Qty: 43 | Fill #1

## 2018-05-04 MED FILL — LATUDA 80 MG TABLET: 80 | 30 days supply | Qty: 30 | Fill #1

## 2018-05-05 MED FILL — ADDERALL XR 20 MG CAP SA: 20 | 30 days supply | Qty: 30 | Fill #0

## 2018-05-12 MED FILL — traZODone HCL 50 MG TABS: 50 | 30 days supply | Qty: 60 | Fill #0

## 2018-05-12 MED FILL — PROPRANOLOL HCL 80 MG TAB: 80 | 30 days supply | Qty: 60 | Fill #2

## 2018-05-25 DIAGNOSIS — R197 Diarrhea, unspecified: Secondary | ICD-10-CM | POA: Diagnosis not present

## 2018-05-25 DIAGNOSIS — K921 Melena: Secondary | ICD-10-CM | POA: Diagnosis not present

## 2018-05-25 MED FILL — SYNTHROID 100 MCG TABLET: 100 | 90 days supply | Qty: 90 | Fill #1

## 2018-05-28 DIAGNOSIS — R197 Diarrhea, unspecified: Secondary | ICD-10-CM | POA: Diagnosis not present

## 2018-05-29 DIAGNOSIS — K259 Gastric ulcer, unspecified as acute or chronic, without hemorrhage or perforation: Secondary | ICD-10-CM | POA: Diagnosis not present

## 2018-05-29 DIAGNOSIS — K2930 Chronic superficial gastritis without bleeding: Secondary | ICD-10-CM | POA: Diagnosis not present

## 2018-05-29 DIAGNOSIS — K921 Melena: Secondary | ICD-10-CM | POA: Diagnosis not present

## 2018-06-01 MED FILL — DULoxetine HCL 60 MG CPEP: 60 | 30 days supply | Qty: 30 | Fill #2

## 2018-06-01 MED FILL — CIPROFLOXACIN HCL 500 MG TA: 500 | 7 days supply | Qty: 14 | Fill #0

## 2018-06-01 MED FILL — HUMIRA PEN 40 MG/0.8ML PNKT: 40 | 28 days supply | Qty: 2 | Fill #8

## 2018-06-05 MED FILL — clonazePAM 1 MG TABS: 1 | 30 days supply | Qty: 30 | Fill #1

## 2018-06-05 MED FILL — LATUDA 80 MG TABLET: 80 | 30 days supply | Qty: 30 | Fill #2

## 2018-06-05 MED FILL — ADDERALL XR 20 MG CAP SA: 20 | 30 days supply | Qty: 30 | Fill #0

## 2018-06-08 MED FILL — SULFAMETHOXAZOLE-TMP DS TAB: 800-160 | 7 days supply | Qty: 14 | Fill #0

## 2018-06-22 MED FILL — ELETRIPTAN HYDROBROMIDE 20: 20 | 30 days supply | Qty: 6 | Fill #3

## 2018-06-22 MED FILL — PROPRANOLOL HCL 80 MG TAB: 80 | 30 days supply | Qty: 60 | Fill #3

## 2018-06-26 DIAGNOSIS — A288 Other specified zoonotic bacterial diseases, not elsewhere classified: Secondary | ICD-10-CM | POA: Diagnosis not present

## 2018-06-29 MED FILL — DULoxetine HCL 60 MG CPEP: 60 | 30 days supply | Qty: 30 | Fill #1

## 2018-06-29 MED FILL — HUMIRA PEN 40 MG/0.4ML PNKT: 40 | 28 days supply | Qty: 2 | Fill #1

## 2018-07-06 MED FILL — LATUDA 80 MG TABLET: 80 | 30 days supply | Qty: 30 | Fill #2

## 2018-07-06 MED FILL — traZODone HCL 50 MG TABS: 50 | 30 days supply | Qty: 60 | Fill #1

## 2018-07-09 MED FILL — ADDERALL XR 20 MG CAP SA: 20 | 30 days supply | Qty: 30 | Fill #0

## 2018-07-16 DIAGNOSIS — F902 Attention-deficit hyperactivity disorder, combined type: Secondary | ICD-10-CM | POA: Diagnosis not present

## 2018-07-16 DIAGNOSIS — F419 Anxiety disorder, unspecified: Secondary | ICD-10-CM | POA: Diagnosis not present

## 2018-07-16 DIAGNOSIS — F3131 Bipolar disorder, current episode depressed, mild: Secondary | ICD-10-CM | POA: Diagnosis not present

## 2018-07-16 MED FILL — clonazePAM 1 MG TABS: 1 | 30 days supply | Qty: 30 | Fill #2

## 2018-07-16 MED FILL — LATUDA 40 MG TABLET: 40 | 30 days supply | Qty: 30 | Fill #0

## 2018-07-27 MED FILL — PROPRANOLOL HCL 80 MG TAB: 80 | 30 days supply | Qty: 60 | Fill #4

## 2018-07-27 MED FILL — HUMIRA PEN 40 MG/0.4ML PNKT: 40 | 28 days supply | Qty: 2 | Fill #2

## 2018-07-29 MED FILL — ESTRADIOL 0.1 MG/GM CREA: 0.1 | 90 days supply | Qty: 43 | Fill #0

## 2018-07-29 MED FILL — OMEPRAZOLE 20 MG CAP: 20 | 90 days supply | Qty: 90 | Fill #0

## 2018-07-29 MED FILL — DULoxetine HCL 60 MG CPEP: 60 | 30 days supply | Qty: 30 | Fill #2

## 2018-08-06 MED FILL — ADDERALL XR 20 MG CAP SA: 20 | 30 days supply | Qty: 30 | Fill #0

## 2018-08-26 MED FILL — SYNTHROID 100 MCG TABLET: 100 | 90 days supply | Qty: 90 | Fill #0

## 2018-08-26 MED FILL — AMOXICILLIN 500 MG CAPSULE: 500 | 2 days supply | Qty: 8 | Fill #1

## 2018-08-26 MED FILL — DULoxetine HCL 60 MG CPEP: 60 | 30 days supply | Qty: 30 | Fill #0

## 2018-08-26 MED FILL — PROPRANOLOL HCL 80 MG TAB: 80 | 30 days supply | Qty: 60 | Fill #5

## 2018-08-27 MED FILL — HUMIRA PEN 40 MG/0.4ML PNKT: 40 | 28 days supply | Qty: 2 | Fill #3

## 2018-09-07 MED FILL — traZODone HCL 50 MG TABS: 50 | 30 days supply | Qty: 60 | Fill #2

## 2018-09-07 MED FILL — LATUDA 40 MG TABLET: 40 | 30 days supply | Qty: 30 | Fill #1

## 2018-09-08 MED FILL — ADDERALL XR 20 MG CAP SA: 20 | 30 days supply | Qty: 30 | Fill #0

## 2018-09-17 DIAGNOSIS — B308 Other viral conjunctivitis: Secondary | ICD-10-CM | POA: Diagnosis not present

## 2018-09-18 DIAGNOSIS — H6123 Impacted cerumen, bilateral: Secondary | ICD-10-CM | POA: Diagnosis not present

## 2018-09-18 DIAGNOSIS — H60333 Swimmer's ear, bilateral: Secondary | ICD-10-CM | POA: Diagnosis not present

## 2018-09-18 MED FILL — CIPRODEX OTIC SUSPENSION: 0.3-0.1 | 9 days supply | Qty: 8 | Fill #0

## 2018-09-22 DIAGNOSIS — B308 Other viral conjunctivitis: Secondary | ICD-10-CM | POA: Diagnosis not present

## 2018-09-28 MED FILL — PROPRANOLOL HCL 80 MG TAB: 80 | 30 days supply | Qty: 60 | Fill #6

## 2018-09-28 MED FILL — DULoxetine HCL 60 MG CPEP: 60 | 30 days supply | Qty: 30 | Fill #1

## 2018-09-29 MED FILL — HUMIRA PEN 40 MG/0.4ML PNKT: 40 | 28 days supply | Qty: 2 | Fill #4

## 2018-10-07 MED FILL — ADDERALL XR 20 MG CAP SA: 20 | 30 days supply | Qty: 30 | Fill #0

## 2018-10-13 DIAGNOSIS — B308 Other viral conjunctivitis: Secondary | ICD-10-CM | POA: Diagnosis not present

## 2018-10-15 MED FILL — LATUDA 40 MG TABLET: 40 | 30 days supply | Qty: 30 | Fill #2

## 2018-10-27 ENCOUNTER — Other Ambulatory Visit: Payer: Self-pay | Admitting: Neurology

## 2018-10-27 MED FILL — DULoxetine HCL 60 MG CPEP: 60 | 30 days supply | Qty: 30 | Fill #2

## 2018-10-27 MED FILL — HUMIRA PEN 40 MG/0.4ML PNKT: 40 | 28 days supply | Qty: 2 | Fill #5

## 2018-10-27 MED FILL — clonazePAM 1 MG TABS: 1 | 30 days supply | Qty: 30 | Fill #1

## 2018-10-28 ENCOUNTER — Telehealth: Payer: Self-pay | Admitting: Neurology

## 2018-10-28 MED ORDER — PROPRANOLOL HCL 80 MG PO TABS: 80.0000 mg | ORAL_TABLET | Freq: Two times a day (BID) | ORAL | 0 refills | Status: DC

## 2018-10-28 MED FILL — PROPRANOLOL HCL 80 MG TAB: 80 | 30 days supply | Qty: 60 | Fill #0

## 2018-10-28 NOTE — Telephone Encounter (Signed)
Dr. Pearlean Brownie pt was last seen 08/2017. PT only schedule appt today because she needs a refill for inderal. Dr. Lucia Gaskins next appt is 01/2019 which pt is schedule for. Do you want to refill or have pt seek PCP.

## 2018-10-28 NOTE — Telephone Encounter (Signed)
Ok to renew?  

## 2018-10-28 NOTE — Telephone Encounter (Signed)
Patient requesting refill of propranolol (INDERAL) 80 MG tablet sent to [facility]. She has appointment with Dr. Lucia Gaskins on 02-03-19.

## 2018-10-28 NOTE — Addendum Note (Signed)
Addended by: Hildred Alamin on: 10/28/2018 03:59 PM   Modules accepted: Orders

## 2018-10-28 NOTE — Telephone Encounter (Signed)
Per DR.SEthi only give pt 30 pills. Dr Lucia Gaskins will have to decide if she wants to renew till 01/2019.

## 2018-10-29 DIAGNOSIS — F3131 Bipolar disorder, current episode depressed, mild: Secondary | ICD-10-CM | POA: Diagnosis not present

## 2018-10-29 DIAGNOSIS — F419 Anxiety disorder, unspecified: Secondary | ICD-10-CM | POA: Diagnosis not present

## 2018-10-29 DIAGNOSIS — F902 Attention-deficit hyperactivity disorder, combined type: Secondary | ICD-10-CM | POA: Diagnosis not present

## 2018-11-03 DIAGNOSIS — H15112 Episcleritis periodica fugax, left eye: Secondary | ICD-10-CM | POA: Diagnosis not present

## 2018-11-03 MED FILL — FLUOROMETHOLONE 0.1% DROPS: 0.1 | 25 days supply | Qty: 5 | Fill #0

## 2018-11-09 MED FILL — ESTRADIOL 0.1 MG/GM CREA: 0.1 | 90 days supply | Qty: 43 | Fill #0

## 2018-11-09 MED FILL — OMEPRAZOLE 20 MG CPDR: 20 | 90 days supply | Qty: 90 | Fill #0

## 2018-11-09 MED FILL — ADDERALL XR 20 MG CAP SA: 20 | 30 days supply | Qty: 30 | Fill #0

## 2018-11-11 DIAGNOSIS — H15112 Episcleritis periodica fugax, left eye: Secondary | ICD-10-CM | POA: Diagnosis not present

## 2018-11-13 MED FILL — LATUDA 40 MG TABLET: 40 | 30 days supply | Qty: 30 | Fill #0

## 2018-11-27 ENCOUNTER — Ambulatory Visit (INDEPENDENT_AMBULATORY_CARE_PROVIDER_SITE_OTHER): Payer: 59 | Admitting: Pharmacist

## 2018-11-27 ENCOUNTER — Other Ambulatory Visit: Payer: Self-pay | Admitting: Pharmacist

## 2018-11-27 ENCOUNTER — Encounter: Payer: Self-pay | Admitting: Pharmacist

## 2018-11-27 ENCOUNTER — Other Ambulatory Visit: Payer: Self-pay | Admitting: Neurology

## 2018-11-27 DIAGNOSIS — Z7189 Other specified counseling: Secondary | ICD-10-CM

## 2018-11-27 MED ORDER — ADALIMUMAB 40 MG/0.8ML ~~LOC~~ AJKT: 0.8000 mL | AUTO-INJECTOR | SUBCUTANEOUS | 2 refills | Status: DC

## 2018-11-27 MED ORDER — ADALIMUMAB 40 MG/0.4ML ~~LOC~~ AJKT: 40.0000 mg | AUTO-INJECTOR | SUBCUTANEOUS | 5 refills | Status: DC

## 2018-11-27 MED FILL — DULoxetine HCL 60 MG CPEP: 60 | 30 days supply | Qty: 30 | Fill #0

## 2018-11-27 MED FILL — clonazePAM 1 MG TABS: 1 | 30 days supply | Qty: 30 | Fill #2

## 2018-11-27 MED FILL — PROPRANOLOL HCL 80 MG TAB: 80 | 30 days supply | Qty: 60 | Fill #0

## 2018-11-27 MED FILL — SYNTHROID 100 MCG TABLET: 100 | 90 days supply | Qty: 90 | Fill #1

## 2018-11-27 MED FILL — HUMIRA PEN 40 MG/0.4ML PNKT: 40 | 28 days supply | Qty: 2 | Fill #0

## 2018-11-27 NOTE — Progress Notes (Signed)
   S: Patient presents today to the [facility] for review of her speciality medication  Patient is currently taking Humira for rheumatoid arthritis. Patient is managed by Dr. Dierdre Forth for this.   Efficacy: reports that it is working well with no adverse effects  Adherence: once late on one dose when she went on vacation but otherwise no missed doses  Dosing:  Rheumatoid arthritis: SubQ: 40 mg every other week   Drug-drug interactions: none  Screening: TB test: completed per patient Hepatitis: completed per patient  Monitoring: S/sx of infection: denies CBC: followed by rheum, WNL per report S/sx of hypersensitivity: denies S/sx of malignancy: denies S/sx of heart failure: denies    O:     Lab Results  Component Value Date   WBC 2.9 (L) 05/31/2013   HGB 11.6 (L) 05/31/2013   HCT 34.6 (L) 05/31/2013   MCV 90.3 05/31/2013   PLT 154 05/31/2013      Chemistry      Component Value Date/Time   NA 139 09/03/2017 1105   K 4.6 09/03/2017 1105   CL 99 09/03/2017 1105   CO2 24 09/03/2017 1105   BUN 18 09/03/2017 1105   CREATININE 0.72 09/03/2017 1105      Component Value Date/Time   CALCIUM 9.3 09/03/2017 1105   ALKPHOS 69 05/31/2013 0530   AST 17 05/31/2013 0530   ALT 6 05/31/2013 0530   BILITOT 0.2 (L) 05/31/2013 0530       A/P: 1. Medication review: patient on Humira for RA, tolerating it well with no adverse effects and improved control of RA. Reviewed the medication with her, including the following: Humira is a TNF blocking agent indicated for ankylosing spondylitis, Crohn's disease, Hidradenitis suppurativa, psoriatic arthritis, plaque psoriasis, ulcerative colitis, and uveitis. The most common adverse effects are infections, headache, and injection site reactions. There is the possibility of an increased risk of malignancy but it is not well understood if this increased risk is due to there medication or the  disease state. There are rare cases of pancytopenia and aplastic anemia. No recommendations for any changes. Fax sent to Dr. Shawnee Knapp office for most recent office visit and lab information.   Alvino Blood, PharmD, BCPS, BCACP, CPP Clinical Pharmacist Practitioner  [phone]

## 2018-12-07 MED FILL — ADDERALL XR 20 MG CAP SA: 20 | 30 days supply | Qty: 30 | Fill #0

## 2018-12-18 MED FILL — LATUDA 40 MG TABLET: 40 | 30 days supply | Qty: 30 | Fill #1

## 2018-12-28 MED FILL — PROPRANOLOL HCL 80 MG TAB: 80 | 30 days supply | Qty: 60 | Fill #1

## 2018-12-28 MED FILL — DULoxetine HCL 60 MG CPEP: 60 | 30 days supply | Qty: 30 | Fill #1

## 2019-01-08 MED FILL — ADDERALL XR 20 MG CAP SA: 20 | 30 days supply | Qty: 30 | Fill #0

## 2019-01-12 MED FILL — HUMIRA PEN 40 MG/0.4ML PNKT: 40 | 28 days supply | Qty: 2 | Fill #1

## 2019-01-13 MED FILL — LATUDA 40 MG TABLET: 40 | 30 days supply | Qty: 30 | Fill #2

## 2019-01-29 MED FILL — clonazePAM 1 MG TABS: 1 | 30 days supply | Qty: 30 | Fill #0 | Status: TO

## 2019-01-29 MED FILL — DULoxetine HCL 60 MG CPEP: 60 | 30 days supply | Qty: 30 | Fill #2

## 2019-01-29 MED FILL — PROPRANOLOL HCL 80 MG TAB: 80 | 30 days supply | Qty: 60 | Fill #2

## 2019-02-03 ENCOUNTER — Encounter: Payer: Self-pay | Admitting: Neurology

## 2019-02-03 ENCOUNTER — Ambulatory Visit (INDEPENDENT_AMBULATORY_CARE_PROVIDER_SITE_OTHER): Payer: 59 | Admitting: Neurology

## 2019-02-03 VITALS — BP 150/105 | HR 83 | Resp 14 | Ht 66.0 in | Wt 183.8 lb

## 2019-02-03 DIAGNOSIS — G43109 Migraine with aura, not intractable, without status migrainosus: Secondary | ICD-10-CM | POA: Diagnosis not present

## 2019-02-03 DIAGNOSIS — G4482 Headache associated with sexual activity: Secondary | ICD-10-CM

## 2019-02-03 DIAGNOSIS — R51 Headache: Secondary | ICD-10-CM

## 2019-02-03 DIAGNOSIS — R519 Headache, unspecified: Secondary | ICD-10-CM

## 2019-02-03 HISTORY — DX: Headache associated with sexual activity: G44.82

## 2019-02-03 MED ORDER — PROPRANOLOL HCL ER 120 MG PO CP24: 120.0000 mg | ORAL_CAPSULE | Freq: Every day | ORAL | 11 refills | Status: DC

## 2019-02-03 MED ORDER — SUMATRIPTAN SUCCINATE 3 MG/0.5ML ~~LOC~~ SOAJ: 3.0000 mg | Freq: Once | SUBCUTANEOUS | 11 refills | Status: DC | PRN

## 2019-02-03 MED ORDER — ELETRIPTAN HYDROBROMIDE 40 MG PO TABS: 40.0000 mg | ORAL_TABLET | ORAL | 11 refills | Status: DC | PRN

## 2019-02-03 MED ORDER — PROPRANOLOL HCL 80 MG PO TABS: 80.0000 mg | ORAL_TABLET | Freq: Three times a day (TID) | ORAL | 11 refills | Status: DC

## 2019-02-03 NOTE — Progress Notes (Signed)
[facility]    Provider:  Dr Lucia Gaskins Referring Provider: Juluis Rainier, MD Primary Care Physician:  Juluis Rainier, MD  CC 08/2017:  Migraines and headaches associated with sexual activity  Interval history 02/03/2019: Propranolol did not work. Indomethacin worked initially but did not continue. She took the max of indomethacin 5 pills. When she checks her BP it is fine at home in the 120s systolic.  We discussed treatment of headaches associated with sexual activity which includes indomethacin, triptans, beta-blockers.  She has tried high doses of indomethacin and did not work.  She is already on propranolol 80 mg twice a day and blood pressure and heart rate are elevated today.  She takes Relpax for migraines which work and we discussed could try this as well.  We discussed many options and decided to try taking an additional propranolol 30 to 60 minutes before snacks or trying indomethacin and a triptan together or a triptan alone 30 to 60 minutes prior.  Continue propranolol 80 mg twice a day.  HPI:  Kathryn Castaneda is a 58 y.o. female here as a referral from Dr. Zachery Dauer for headache. Past medical history arthritis, thyroid, EEG migraine, depression, anxiety. Migraines started in 2002 and they are infrequent, less than 1x a month and replax. She gets colored vision, pounding throbbing, light and sound sensitivity , infrequent nausea, takes the relpax and works. She starte dhaving orgasmic headaches 10 years ago, every time she has an orgasm she has an explosive headache. Propranolol helped in the past. They tried propranolol befpre sex and it didn't work and now on propranolol 60 mg twice a day not working. Sharp, severe, pounding, no other associated symptoms, resolved in 15-20 minutes.  No other focal neurologic deficits, associated symptoms, inciting events or modifiable factors.  Reviewed notes, labs and imaging from outside physicians, which showed:  Reviewed notes from  primary care. Patient has a history of migraines and she is on propranolol. Also anxiety, insomnia, ADHD, hypothyroidism, GERD, depression, hyperlipidemia, remote history of hepatitis C, polyarticular psoriatic arthritis. She was referred here for migraines. Headaches have always been worse with orgasm and can last for 15 minutes afterwards. Recently her headaches have been worse at the same exact characteristic his previous headaches. She was put on propranolol in the past in one point she was up to 3 daily and that did help but then it went back down to 2 daily because the headaches were to control. She is taking propranolol an hour before intercourse but that has not really helped. Recent blood pressures have been slightly elevated, she is still using the Relpax frequently once or twice a month, she has no strokelike symptoms or no unusual chest pain or shortness of breath. Exam was normal OD neurologic evaluation. Her propranolol was increased to 40 mg in the a.m. and 20 mg in the PT in March 2018.  Labs include CMP normal with creatinine 0.8 BUN 18 and August 2017, TSH normal, LDL elevated at 140  Review of Systems: Patient complains of symptoms per HPI as well as the following symptoms: headache, migraie. Pertinent negatives and positives per HPI. All others negative.   Social History   Socioeconomic History  . Marital status: Married    Spouse name: Not on file  . Number of children: Not on file  . Years of education: Not on file  . Highest education level: Not on file  Occupational History  . Not on file  Social Needs  . Financial resource strain: Not  on file  . Food insecurity:    Worry: Not on file    Inability: Not on file  . Transportation needs:    Medical: Not on file    Non-medical: Not on file  Tobacco Use  . Smoking status: Never Smoker  . Smokeless tobacco: Never Used  Substance and Sexual Activity  . Alcohol use: Yes    Comment: occ  . Drug use: No  . Sexual  activity: Not on file  Lifestyle  . Physical activity:    Days per week: Not on file    Minutes per session: Not on file  . Stress: Not on file  Relationships  . Social connections:    Talks on phone: Not on file    Gets together: Not on file    Attends religious service: Not on file    Active member of club or organization: Not on file    Attends meetings of clubs or organizations: Not on file    Relationship status: Not on file  . Intimate partner violence:    Fear of current or ex partner: Not on file    Emotionally abused: Not on file    Physically abused: Not on file    Forced sexual activity: Not on file  Other Topics Concern  . Not on file  Social History Narrative  . Not on file    Family History  Problem Relation Age of Onset  . Stroke Neg Hx   . Headache Neg Hx     Past Medical History:  Diagnosis Date  . Acid reflux   . ADHD (attention deficit hyperactivity disorder)   . Anxiety   . Arthritis   . Depression   . Headache   . Hepatitis C    had treatment 1990's  . Hyperlipemia   . Hypothyroidism   . Thyroid disease     Past Surgical History:  Procedure Laterality Date  . ABDOMINAL HYSTERECTOMY    . CESAREAN SECTION    . JOINT REPLACEMENT Right   . ROTATOR CUFF REPAIR Right   . SHOULDER ARTHROSCOPY WITH BICEPSTENOTOMY Left 02/20/2016   Procedure: SHOULDER ARTHROSCOPY SUPSCAPULARIS REPAIR WITH BICEPS TENOTOMY OPEN TENODESIS;  Surgeon: Jones Broom, MD;  Location: [facility];  Service: Orthopedics;  Laterality: Left;    Current Outpatient Medications  Medication Sig Dispense Refill  . Adalimumab (HUMIRA PEN) 40 MG/0.4ML PNKT Inject 40 mg into the skin every 14 (fourteen) days. 40 mg every 2 weeks subcutaneous 28 days 2 each 5  . amphetamine-dextroamphetamine (ADDERALL XR) 20 MG 24 hr capsule Take 20 mg by mouth daily.    . clonazePAM (KLONOPIN) 1 MG tablet Take 1 mg by mouth daily as needed for anxiety (sleep).     . DULoxetine  (CYMBALTA) 60 MG capsule     . Eletriptan Hydrobromide (RELPAX PO) Take 1 capsule by mouth daily as needed.    Marland Kitchen levothyroxine (SYNTHROID, LEVOTHROID) 100 MCG tablet Take 100 mcg by mouth daily before breakfast.    . lurasidone (LATUDA) 80 MG TABS tablet Take 80 mg by mouth daily with breakfast.    . Multiple Vitamin (MULTIVITAMIN WITH MINERALS) TABS tablet Take 1 tablet by mouth daily.    . naproxen (NAPROSYN) 500 MG tablet Take 1 tablet (500 mg total) by mouth 2 (two) times daily with a meal. 30 tablet 0  . omeprazole (PRILOSEC) 20 MG capsule Take 20 mg by mouth daily.    . SUMAtriptan Succinate (ZEMBRACE SYMTOUCH) 3 MG/0.5ML SOAJ Inject  3 mg into the skin once as needed for up to 1 dose. May repeat in 15 minutes. If symptoms persist, repeat in 2 hours once. 8 pen 11  . eletriptan (RELPAX) 40 MG tablet Take 1 tablet (40 mg total) by mouth as needed for migraine or headache. May repeat in 2 hours if headache persists or recurs. 10 tablet 11  . propranolol (INDERAL) 80 MG tablet Take 1 tablet (80 mg total) by mouth 3 (three) times daily. 90 tablet 11   No current facility-administered medications for this visit.    Facility-Administered Medications Ordered in Other Visits  Medication Dose Route Frequency Provider Last Rate Last Dose  . gadopentetate dimeglumine (MAGNEVIST) injection 20 mL  20 mL Intravenous Once PRN Anson FretAhern, Kamarah Bilotta B, MD        Allergies as of 02/03/2019 - Review Complete 11/27/2018  Allergen Reaction Noted  . Ultram [tramadol hcl] Other (See Comments) 12/17/2015  . Lactose intolerance (gi) Diarrhea 03/02/2014    Vitals: BP (!) 150/105   Pulse 83   Resp 14   Ht 5\' 6"  (1.676 m)   Wt 183 lb 12.8 oz (83.4 kg)   BMI 29.67 kg/m  Last Weight:  Wt Readings from Last 1 Encounters:  02/03/19 183 lb 12.8 oz (83.4 kg)   Last Height:   Ht Readings from Last 1 Encounters:  02/03/19 5\' 6"  (1.676 m)   Physical exam: Exam: Gen: NAD, conversant, well nourised, obese, well  groomed                     CV: RRR, no MRG. No Carotid Bruits. No peripheral edema, warm, nontender Eyes: Conjunctivae clear without exudates or hemorrhage  Neuro: Detailed Neurologic Exam  Speech:    Speech is normal; fluent and spontaneous with normal comprehension.  Cognition:    The patient is oriented to person, place, and time;     recent and remote memory intact;     language fluent;     normal attention, concentration,     fund of knowledge Cranial Nerves:    The pupils are equal, round, and reactive to light. The fundi are normal and spontaneous venous pulsations are present. Visual fields are full to finger confrontation. Extraocular movements are intact. Trigeminal sensation is intact and the muscles of mastication are normal. The face is symmetric. The palate elevates in the midline. Hearing intact. Voice is normal. Shoulder shrug is normal. The tongue has normal motion without fasciculations.   Coordination:    Normal finger to nose and heel to shin. Normal rapid alternating movements.   Gait:    Heel-toe and tandem gait are normal.   Motor Observation:    No asymmetry, no atrophy, and no involuntary movements noted. Tone:    Normal muscle tone.    Posture:    Posture is normal. normal erect    Strength: right prox weakness 4/5 leg,     Strength is V/V in the upper and lower limbs.      Sensation: intact to LT     Reflex Exam:  DTR's:    Deep tendon reflexes in the upper and lower extremities are brisk bilaterally.  Right biceps may be more brisk. Toes:    The toes are downgoing bilaterally.   Clonus:    Clonus is absent.       Assessment/Plan:  Patient with headache associated with sex  Continue to take propranolol twice daily. May take a third dose 30-60 minutes before sex Can  also try Relpax 30-60 minutes before sex. May take with indomethacin. If you get the headache, take Zembrace injectable    Meds ordered this encounter  Medications  .  SUMAtriptan Succinate (ZEMBRACE SYMTOUCH) 3 MG/0.5ML SOAJ    Sig: Inject 3 mg into the skin once as needed for up to 1 dose. May repeat in 15 minutes. If symptoms persist, repeat in 2 hours once.    Dispense:  8 pen    Refill:  11    Patient has a copay card can get this prescription regardless of insurance approval  . DISCONTD: propranolol ER (INDERAL LA) 120 MG 24 hr capsule    Sig: Take 1 capsule (120 mg total) by mouth daily.    Dispense:  30 capsule    Refill:  11  . propranolol (INDERAL) 80 MG tablet    Sig: Take 1 tablet (80 mg total) by mouth 3 (three) times daily.    Dispense:  90 tablet    Refill:  11    Please disregard prescription for 120ER.  Marland Kitchen. eletriptan (RELPAX) 40 MG tablet    Sig: Take 1 tablet (40 mg total) by mouth as needed for migraine or headache. May repeat in 2 hours if headache persists or recurs.    Dispense:  10 tablet    Refill:  11    Discussed: To prevent or relieve headaches, try the following: Cool Compress. Lie down and place a cool compress on your head.  Avoid headache triggers. If certain foods or odors seem to have triggered your migraines in the past, avoid them. A headache diary might help you identify triggers.  Include physical activity in your daily routine. Try a daily walk or other moderate aerobic exercise.  Manage stress. Find healthy ways to cope with the stressors, such as delegating tasks on your to-do list.  Practice relaxation techniques. Try deep breathing, yoga, massage and visualization.  Eat regularly. Eating regularly scheduled meals and maintaining a healthy diet might help prevent headaches. Also, drink plenty of fluids.  Follow a regular sleep schedule. Sleep deprivation might contribute to headaches Consider biofeedback. With this mind-body technique, you learn to control certain bodily functions - such as muscle tension, heart rate and blood pressure - to prevent headaches or reduce headache pain.    Proceed to emergency room  if you experience new or worsening symptoms or symptoms do not resolve, if you have new neurologic symptoms or if headache is severe, or for any concerning symptom.   Provided education and documentation from American headache Society toolbox including articles on: chronic migraine medication overuse headache, chronic migraines, prevention of migraines, behavioral and other nonpharmacologic treatments for headache.  A total of 25 minutes was spent face-to-face with this patient. Over half this time was spent on counseling patient on the  1. Headache associated with sexual activity   2. Migraine with aura and without status migrainosus, not intractable   3. Nonintractable episodic headache, unspecified headache type    diagnosis and different diagnostic and therapeutic options, counseling and coordination of care, risks ans benefits of management, compliance, or risk factor reduction and education.     Naomie DeanAntonia Charese Abundis, MD  [facility] [address] [place], [place] [id number]  Phone [phone] Fax [phone]

## 2019-02-03 NOTE — Patient Instructions (Signed)
Continue to take propranolol twice daily. May take a third dose 30-60 minutes before sex Can also try Relpax 30-60 minutes before sex. May take with indomethacin. If you get the headache, take Zembrace injectable

## 2019-02-04 ENCOUNTER — Other Ambulatory Visit: Payer: Self-pay | Admitting: Neurology

## 2019-02-04 ENCOUNTER — Telehealth: Payer: Self-pay | Admitting: Neurology

## 2019-02-04 DIAGNOSIS — G9389 Other specified disorders of brain: Secondary | ICD-10-CM

## 2019-02-04 DIAGNOSIS — I7770 Dissection of unspecified artery: Secondary | ICD-10-CM

## 2019-02-04 DIAGNOSIS — R519 Headache, unspecified: Secondary | ICD-10-CM

## 2019-02-04 DIAGNOSIS — I7771 Dissection of carotid artery: Secondary | ICD-10-CM

## 2019-02-04 DIAGNOSIS — G43109 Migraine with aura, not intractable, without status migrainosus: Secondary | ICD-10-CM

## 2019-02-04 DIAGNOSIS — I609 Nontraumatic subarachnoid hemorrhage, unspecified: Secondary | ICD-10-CM

## 2019-02-04 DIAGNOSIS — R51 Headache: Principal | ICD-10-CM

## 2019-02-04 DIAGNOSIS — I671 Cerebral aneurysm, nonruptured: Secondary | ICD-10-CM

## 2019-02-04 MED FILL — INDOMETHACIN 25 MG CAPSULE: 25 | 11 days supply | Qty: 100 | Fill #0

## 2019-02-04 NOTE — Telephone Encounter (Signed)
Eletriptan has been approved by [employer]. Approval dates: 02/04/2019 through 02/04/2020.  PA# [id number]  Called pt and informed her of the approval. She stated the [facility] was denied already (we had not received any letters yet). I advised we can leave a savings card up front and she can get it free. She will come tomorrow. She verbalized appreciation.   zembrace card at front desk.

## 2019-02-04 NOTE — Telephone Encounter (Addendum)
Completed Zembrace PA on Cover My Meds. KEY: [id number]. Tried/failed meds: Propranolol, Indomethacin, Sumatriptan.  Awaiting [employer] determination within 48 hours. [employer] contact: [phone].

## 2019-02-04 NOTE — Telephone Encounter (Addendum)
I spoke with [facility]. They have received the prescriptions. Both Zembrace & Eletriptan require PAs.

## 2019-02-04 NOTE — Telephone Encounter (Signed)
Pt called stating that she was to get Relpax 30-60 called in to [facility] and she states that it has not been called in as of yet. Please advise.

## 2019-02-04 NOTE — Telephone Encounter (Signed)
Completed Relpax PA on cover my meds. KEY: [id number]. Tried/failed meds: Sumatriptan, Propranolol, Indomethacin.   Awaiting med[employer] determination within 24 hours. med[employer] contact: [phone].

## 2019-02-05 MED FILL — HUMIRA PEN 40 MG/0.4ML PNKT: 40 | 28 days supply | Qty: 2 | Fill #2 | Status: TO

## 2019-02-05 MED FILL — ELETRIPTAN HYDROBROMIDE 40: 40 | 30 days supply | Qty: 10 | Fill #0

## 2019-02-06 MED FILL — ADDERALL XR 20 MG CAP SA: 20 | 30 days supply | Qty: 30 | Fill #0

## 2019-02-08 MED FILL — OMEPRAZOLE 20 MG CPDR: 20 | 90 days supply | Qty: 90 | Fill #0 | Status: TO

## 2019-02-08 NOTE — Telephone Encounter (Signed)
We received the letter from [employer] that Zembrace was denied because the pharmacy benefit excludes coverage for [facility]. Generic Sumatriptan vials/cartridges/pens/nasal spray & Zomig are covered. Patient was already told about the savings card for [facility] and it was placed at the front desk for pt pickup.

## 2019-02-17 MED FILL — LATUDA 40 MG TABLET: 40 | 30 days supply | Qty: 30 | Fill #3

## 2019-02-22 MED FILL — DULoxetine HCL 60 MG CPEP: 60 | 30 days supply | Qty: 30 | Fill #3

## 2019-02-22 MED FILL — PROPRANOLOL 80 MG TABLET: 80 | 30 days supply | Qty: 90 | Fill #0 | Status: TO

## 2019-02-22 MED FILL — LEVOTHYROXINE 100 MCG TABLE: 100 | 90 days supply | Qty: 90 | Fill #0

## 2019-02-23 MED FILL — ESTRADIOL 0.1 MG/GM CREA: 0.1 | 90 days supply | Qty: 43 | Fill #0 | Status: TO

## 2019-03-03 MED FILL — clonazePAM 1 MG TABS: 1 | 30 days supply | Qty: 30 | Fill #0

## 2019-03-15 MED FILL — HUMIRA PEN 40 MG/0.4ML PNKT: 40 | 28 days supply | Qty: 2 | Fill #0

## 2019-03-15 MED FILL — LATUDA 40 MG TABLET: 40 | 30 days supply | Qty: 30 | Fill #0

## 2019-03-15 MED FILL — ADDERALL XR 20 MG CAP SA: 20 | 30 days supply | Qty: 30 | Fill #0

## 2019-03-18 MED FILL — DULOXETINE HCL 60 MG CPEP: 60 | 30 days supply | Qty: 30 | Fill #0

## 2019-03-22 MED FILL — LATUDA 60 MG TABLET: 60 | 30 days supply | Qty: 30 | Fill #0

## 2019-04-05 ENCOUNTER — Telehealth: Payer: Self-pay | Admitting: *Deleted

## 2019-04-05 NOTE — Telephone Encounter (Signed)
I have attempted multiple times to reach this patient regarding her upcoming appointment.  Her mailbox is full and I am unable to left a message.  Left message on her daughter's vm (on DPR) to have pt c/b to discuss appointment.  Also attempted husband's phone number however did not feel comfortable leaving a vm as the recording on it doesn't sound as if it secure.  Need to move her appt to 11 am (as day)and also discuss virtual vs phone appt and obtain consent.

## 2019-04-07 ENCOUNTER — Telehealth: Payer: Self-pay

## 2019-04-07 MED FILL — CEPHALEXIN 500 MG CAPSULE: 500 | 7 days supply | Qty: 21 | Fill #0

## 2019-04-07 NOTE — Telephone Encounter (Signed)
pls see phone note from 4/29 for consent documentation.

## 2019-04-07 NOTE — Telephone Encounter (Signed)
Virtual Visit Pre-Appointment Phone Call  "(Name), I am calling you today to discuss your upcoming appointment. We are currently trying to limit exposure to the virus that causes COVID-19 by seeing patients at home rather than in the office."  1. "What is the BEST phone number to call the day of the visit?" - include this in appointment notes  2. "Do you have or have access to (through a family member/friend) a smartphone with video capability that we can use for your visit?" a. If yes - list this number in appt notes as "cell" (if different from BEST phone #) and list the appointment type as a VIDEO visit in appointment notes b. If no - list the appointment type as a PHONE visit in appointment notes  3. Confirm consent - "In the setting of the current Covid19 crisis, you are scheduled for a (phone or video) visit with your provider on (date) at (time).  Just as we do with many in-office visits, in order for you to participate in this visit, we must obtain consent.  If you'd like, I can send this to your mychart (if signed up) or email for you to review.  Otherwise, I can obtain your verbal consent now.  All virtual visits are billed to your insurance company just like a normal visit would be.  By agreeing to a virtual visit, we'd like you to understand that the technology does not allow for your provider to perform an examination, and thus may limit your provider's ability to fully assess your condition. If your provider identifies any concerns that need to be evaluated in person, we will make arrangements to do so.  Finally, though the technology is pretty good, we cannot assure that it will always work on either your or our end, and in the setting of a video visit, we may have to convert it to a phone-only visit.  In either situation, we cannot ensure that we have a secure connection.  Are you willing to proceed?" STAFF: Did the patient verbally acknowledge consent to telehealth visit? YES  4.  Advise patient to be prepared - "Two hours prior to your appointment, go ahead and check your blood pressure, pulse, oxygen saturation, and your weight (if you have the equipment to check those) and write them all down. When your visit starts, your provider will ask you for this information. If you have an Apple Watch or Kardia device, please plan to have heart rate information ready on the day of your appointment. Please have a pen and paper handy nearby the day of the visit as well."  5. Give patient instructions for MyChart download to smartphone OR Doximity/Doxy.me as below if video visit (depending on what platform provider is using)  6. Inform patient they will receive a phone call 15 minutes prior to their appointment time (may be from unknown caller ID) so they should be prepared to answer    TELEPHONE CALL NOTE  Jenniferlee Tremont has been deemed a candidate for a follow-up tele-health visit to limit community exposure during the Covid-19 pandemic. I spoke with the patient via phone to ensure availability of phone/video source, confirm preferred email & phone number, and discuss instructions and expectations.  I reminded Takeia Krayer to be prepared with any vital sign and/or heart rhythm information that could potentially be obtained via home monitoring, at the time of her visit. I reminded Elve Dohrman to expect a phone call prior to her visit.  Amador Cunas  Sherrell Puller, [facility] 04/07/2019 9:23 AM   INSTRUCTIONS FOR DOWNLOADING THE MYCHART APP TO SMARTPHONE  - The patient must first make sure to have activated MyChart and know their login information - If Apple, go to [employer] and type in MyChart in the search bar and download the app. If Android, ask patient to go to [employer] and type in [place] in the search bar and download the app. The app is free but as with any other app downloads, their phone may require them to verify saved payment information or Apple/Android password.  -  The patient will need to then log into the app with their MyChart username and password, and select [facility] as their healthcare provider to link the account. When it is time for your visit, go to the MyChart app, find appointments, and click Begin Video Visit. Be sure to Select Allow for your device to access the Microphone and Camera for your visit. You will then be connected, and your provider will be with you shortly.  **If they have any issues connecting, or need assistance please contact MyChart service desk [phone])**  **If using a computer, in order to ensure the best quality for their visit they will need to use either of the following Internet Browsers: [employer], or Google Chrome**  IF USING DOXIMITY or DOXY.ME - The patient will receive a link just prior to their visit by text.     FULL LENGTH CONSENT FOR TELE-HEALTH VISIT   I hereby voluntarily request, consent and authorize [facility] and its employed or contracted physicians, physician assistants, nurse practitioners or other licensed health care professionals (the Practitioner), to provide me with telemedicine health care services (the "Services") as deemed necessary by the treating Practitioner. I acknowledge and consent to receive the Services by the Practitioner via telemedicine. I understand that the telemedicine visit will involve communicating with the Practitioner through live audiovisual communication technology and the disclosure of certain medical information by electronic transmission. I acknowledge that I have been given the opportunity to request an in-person assessment or other available alternative prior to the telemedicine visit and am voluntarily participating in the telemedicine visit.  I understand that I have the right to withhold or withdraw my consent to the use of telemedicine in the course of my care at any time, without affecting my right to future care or treatment, and that the  Practitioner or I may terminate the telemedicine visit at any time. I understand that I have the right to inspect all information obtained and/or recorded in the course of the telemedicine visit and may receive copies of available information for a reasonable fee.  I understand that some of the potential risks of receiving the Services via telemedicine include:  Marland Kitchen Delay or interruption in medical evaluation due to technological equipment failure or disruption; . Information transmitted may not be sufficient (e.g. poor resolution of images) to allow for appropriate medical decision making by the Practitioner; and/or  . In rare instances, security protocols could fail, causing a breach of personal health information.  Furthermore, I acknowledge that it is my responsibility to provide information about my medical history, conditions and care that is complete and accurate to the best of my ability. I acknowledge that Practitioner's advice, recommendations, and/or decision may be based on factors not within their control, such as incomplete or inaccurate data provided by me or distortions of diagnostic images or specimens that may result from electronic transmissions. I understand that the practice  of medicine is not an Chief Strategy Officer and that Practitioner makes no warranties or guarantees regarding treatment outcomes. I acknowledge that I will receive a copy of this consent concurrently upon execution via email to the email address I last provided but may also request a printed copy by calling the office of [place].    I understand that my insurance will be billed for this visit.   I have read or had this consent read to me. . I understand the contents of this consent, which adequately explains the benefits and risks of the Services being provided via telemedicine.  . I have been provided ample opportunity to ask questions regarding this consent and the Services and have had my questions answered to my  satisfaction. . I give my informed consent for the services to be provided through the use of telemedicine in my medical care  By participating in this telemedicine visit I agree to the above.

## 2019-04-12 MED FILL — PROPRANOLOL 80 MG TABLET: 80 | 30 days supply | Qty: 90 | Fill #0

## 2019-04-12 MED FILL — clonazePAM 1 MG TABS: 1 | 30 days supply | Qty: 30 | Fill #0

## 2019-04-13 ENCOUNTER — Encounter: Payer: Self-pay | Admitting: Cardiology

## 2019-04-13 ENCOUNTER — Encounter: Payer: Self-pay | Admitting: *Deleted

## 2019-04-13 ENCOUNTER — Telehealth (INDEPENDENT_AMBULATORY_CARE_PROVIDER_SITE_OTHER): Payer: 59 | Admitting: Cardiology

## 2019-04-13 ENCOUNTER — Other Ambulatory Visit: Payer: Self-pay

## 2019-04-13 VITALS — BP 132/110 | HR 68 | Ht 66.0 in | Wt 180.0 lb

## 2019-04-13 DIAGNOSIS — Z8249 Family history of ischemic heart disease and other diseases of the circulatory system: Secondary | ICD-10-CM

## 2019-04-13 DIAGNOSIS — R079 Chest pain, unspecified: Secondary | ICD-10-CM | POA: Diagnosis not present

## 2019-04-13 DIAGNOSIS — E782 Mixed hyperlipidemia: Secondary | ICD-10-CM

## 2019-04-13 DIAGNOSIS — I700 Atherosclerosis of aorta: Secondary | ICD-10-CM

## 2019-04-13 DIAGNOSIS — Z79899 Other long term (current) drug therapy: Secondary | ICD-10-CM

## 2019-04-13 MED ORDER — ATORVASTATIN CALCIUM 40 MG PO TABS: 40.0000 mg | ORAL_TABLET | Freq: Every day | ORAL | 3 refills | Status: DC

## 2019-04-13 MED ORDER — ASPIRIN EC 81 MG PO TBEC: 81.0000 mg | DELAYED_RELEASE_TABLET | Freq: Every day | ORAL | 3 refills | Status: DC

## 2019-04-13 MED FILL — ATORVASTATIN 40 MG TABLET: 40 | 90 days supply | Qty: 90 | Fill #0

## 2019-04-13 NOTE — Progress Notes (Signed)
Virtual Visit via Video Note   This visit type was conducted due to national recommendations for restrictions regarding the COVID-19 Pandemic (e.g. social distancing) in an effort to limit this patient's exposure and mitigate transmission in our community.  Due to her co-morbid illnesses, this patient is at least at moderate risk for complications without adequate follow up.  This format is felt to be most appropriate for this patient at this time.  All issues noted in this document were discussed and addressed.  A limited physical exam was performed with this format.  Please refer to the patient's chart for her consent to telehealth for [facility].   Date:  04/13/2019   ID:  Kathryn Castaneda, DOB 1961-07-22, MRN [id number]  Patient Location: Home Provider Location: Home  PCP:  Juluis Rainier, MD  Cardiologist:  Donato Schultz, MD  Electrophysiologist:  None   Evaluation Performed:  Consultation - Kathryn Castaneda was referred by Dr. Zachery Dauer for the evaluation of chest pain.  Chief Complaint: Chest pain  History of Present Illness:    Kathryn Castaneda is a 58 y.o. female with history of prior treated hepatitis C and migraine headache, anxiety, here for the evaluation of chest discomfort at the request of Dr. Juluis Rainier.  She has been experiencing this discomfort off and on with and without exertional activity. In center of chest, tight, moderate in severity 10 min duration.  Started 8 months ago. Once every 2 weeks.  Mostly can happen on couch, TV. Non exertional.  Does not seem to be associated with food  FHX - Mother MI in late 41's.  No tob.  No DM   The patient does not have symptoms concerning for COVID-19 infection (fever, chills, cough, or new shortness of breath).    Past Medical History:  Diagnosis Date  . Acid reflux   . ADHD (attention deficit hyperactivity disorder)   . Anxiety   . Arthritis   . Depression   . Dilation of biliary tract 05/29/2013  . Enteritis  05/29/2013  . Headache   . Headache associated with sexual activity 02/03/2019  . Hepatitis C    had treatment 1990's  . Hyperlipemia   . Hypokalemia 05/29/2013  . Hypothyroidism   . Migraine 05/29/2013  . Thyroid disease    Past Surgical History:  Procedure Laterality Date  . ABDOMINAL HYSTERECTOMY    . CESAREAN SECTION    . JOINT REPLACEMENT Right   . ROTATOR CUFF REPAIR Right   . SHOULDER ARTHROSCOPY WITH BICEPSTENOTOMY Left 02/20/2016   Procedure: SHOULDER ARTHROSCOPY SUPSCAPULARIS REPAIR WITH BICEPS TENOTOMY OPEN TENODESIS;  Surgeon: Jones Broom, MD;  Location: [facility];  Service: Orthopedics;  Laterality: Left;     Current Meds  Medication Sig  . Adalimumab (HUMIRA PEN) 40 MG/0.4ML PNKT Inject 40 mg into the skin every 14 (fourteen) days. 40 mg every 2 weeks subcutaneous 28 days  . amphetamine-dextroamphetamine (ADDERALL XR) 20 MG 24 hr capsule Take 20 mg by mouth daily.  . cephALEXin (KEFLEX) 500 MG capsule Take 500 mg by mouth 3 (three) times daily.  . clonazePAM (KLONOPIN) 1 MG tablet Take 1 mg by mouth daily as needed for anxiety (sleep).   . DULoxetine (CYMBALTA) 60 MG capsule   . eletriptan (RELPAX) 40 MG tablet Take 1 tablet (40 mg total) by mouth as needed for migraine or headache. May repeat in 2 hours if headache persists or recurs.  Marland Kitchen estradiol (ESTRACE) 0.1 MG/GM vaginal cream Place 1 Applicatorful vaginally 2 (two)  times a week.  . levothyroxine (SYNTHROID, LEVOTHROID) 100 MCG tablet Take 100 mcg by mouth daily before breakfast.  . lurasidone (LATUDA) 80 MG TABS tablet Take 80 mg by mouth daily with breakfast.  . Multiple Vitamin (MULTIVITAMIN WITH MINERALS) TABS tablet Take 1 tablet by mouth daily.  . naproxen (NAPROSYN) 500 MG tablet Take 1 tablet (500 mg total) by mouth 2 (two) times daily with a meal.  . omeprazole (PRILOSEC) 20 MG capsule Take 20 mg by mouth daily.  . propranolol (INDERAL) 80 MG tablet Take 1 tablet (80 mg total) by mouth 3  (three) times daily. (Patient taking differently: Take 80 mg by mouth 2 (two) times daily. )     Allergies:   Ultram [tramadol hcl] and Lactose intolerance (gi)   Social History   Tobacco Use  . Smoking status: Never Smoker  . Smokeless tobacco: Never Used  Substance Use Topics  . Alcohol use: Yes    Comment: occ  . Drug use: No     Family Hx: The patient's family history is negative for Stroke and Headache.  ROS:   Please see the history of present illness.    Denies any fevers chills cough shortness of breath orthopnea PND bleeding All other systems reviewed and are negative.   Prior CV studies:   The following studies were reviewed today:  CT abdomen and pelvis from 05/29/2013- mild distal aortic atherosclerosis noted  Labs/Other Tests and Data Reviewed:    EKG:  An ECG dated 02/08/2019 was personally reviewed today and demonstrated:  Sinus rhythm with minimally widened QRS complex, no ischemic changes  Recent Labs: No results found for requested labs within last 8760 hours.   Recent Lipid Panel No results found for: CHOL, TRIG, HDL, CHOLHDL, LDLCALC, LDLDIRECT   LDL 111 Creat 0.96  Wt Readings from Last 3 Encounters:  04/13/19 180 lb (81.6 kg)  02/03/19 183 lb 12.8 oz (83.4 kg)  09/03/17 162 lb 12.8 oz (73.8 kg)     Objective:    Vital Signs:  BP (!) 132/110   Pulse 68   Ht 5\' 6"  (1.676 m)   Wt 180 lb (81.6 kg)   BMI 29.05 kg/m    VITAL SIGNS:  reviewed GEN:  no acute distress EYES:  sclerae anicteric, EOMI - Extraocular Movements Intact RESPIRATORY:  normal respiratory effort, symmetric expansion CARDIOVASCULAR:  no peripheral edema SKIN:  no rash, lesions or ulcers. MUSCULOSKELETAL:  no obvious deformities. NEURO:  alert and oriented x 3, no obvious focal deficit PSYCH:  normal affect  ASSESSMENT & PLAN:    Chest discomfort  - NUC stress would be reasonable to perform.  Does not seem to have a significant exertional component to this.  I think  since there will be a delay in coronary CT scan, I would like for her to go ahead and proceed with re-stratification via an Lexiscan stress test.  Aortic atherosclerosis - Noted on CT scan 05/29/2013 personally reviewed.  Recommend risk factor modification, aspirin 81 mg, statin therapy.  We are going to start atorvastatin 40 mg once a day.  Counseled on potential side effects.  Check lipid panel and ALT in 2 months.   COVID-19 Education: The signs and symptoms of COVID-19 were discussed with the patient and how to seek care for testing (follow up with PCP or arrange E-visit).  The importance of social distancing was discussed today.  Time:   Today, I have spent 35 minutes with the patient with telehealth technology discussing  the above problems.     Medication Adjustments/Labs and Tests Ordered: Current medicines are reviewed at length with the patient today.  Concerns regarding medicines are outlined above.   Tests Ordered: Orders Placed This Encounter  Procedures  . ALT  . Lipid panel  . MYOCARDIAL PERFUSION IMAGING    Medication Changes: Meds ordered this encounter  Medications  . aspirin EC 81 MG tablet    Sig: Take 1 tablet (81 mg total) by mouth daily.    Dispense:  90 tablet    Refill:  3  . atorvastatin (LIPITOR) 40 MG tablet    Sig: Take 1 tablet (40 mg total) by mouth daily.    Dispense:  90 tablet    Refill:  3    Disposition:  Follow up in 6 month(s)  Signed, Donato SchultzMark Remingtyn Depaola, MD  04/13/2019 12:32 PM    [facility]

## 2019-04-13 NOTE — Patient Instructions (Addendum)
Medication Instructions:  Please start Aspirin 81 mg daily. Start Atorvastatin 40 mg daily. Continue any other medications as listed.  If you need a refill on your cardiac medications before your next appointment, please call your pharmacy.   Lab work: Please have fasting blood work in 2 months (Lipid/ALT) If you have labs (blood work) drawn today and your tests are completely normal, you will receive your results only by: Marland Kitchen MyChart Message (if you have MyChart) OR . A paper copy in the mail If you have any lab test that is abnormal or we need to change your treatment, we will call you to review the results.  Testing/Procedures: Your physician has requested that you have a lexiscan myoview.  Please follow instruction sheet, as given.  You will be contacted to be scheduled for this testing to be completed at the [address] office location.  Follow-Up: Follow up in 6 months with Dr. Anne Fu.  You will receive a letter in the mail 2 months before you are due.  Please call us when you receive this letter to schedule your follow up appointment.  Thank you for choosing [facility]!!

## 2019-04-14 MED FILL — HUMIRA PEN 40 MG/0.4ML PNKT: 40 | 28 days supply | Qty: 2 | Fill #1

## 2019-04-15 MED FILL — ADDERALL XR 20 MG CAP SA: 20 | 30 days supply | Qty: 30 | Fill #0

## 2019-04-23 ENCOUNTER — Telehealth (HOSPITAL_COMMUNITY): Payer: Self-pay | Admitting: *Deleted

## 2019-04-23 NOTE — Telephone Encounter (Signed)
Attempted to call give instruction for upcoming myocardial perfusion on 04/27/19 @ 7:30.  Could not leave a message due to mail box being full.

## 2019-04-26 MED FILL — DULOXETINE HCL 60 MG CPEP: 60 | 30 days supply | Qty: 30 | Fill #1

## 2019-04-27 ENCOUNTER — Other Ambulatory Visit: Payer: Self-pay

## 2019-04-27 ENCOUNTER — Ambulatory Visit (HOSPITAL_COMMUNITY): Payer: 59 | Attending: Cardiology

## 2019-04-27 DIAGNOSIS — Z8249 Family history of ischemic heart disease and other diseases of the circulatory system: Secondary | ICD-10-CM | POA: Diagnosis not present

## 2019-04-27 DIAGNOSIS — R079 Chest pain, unspecified: Secondary | ICD-10-CM

## 2019-04-27 LAB — MYOCARDIAL PERFUSION IMAGING
LV dias vol: 61 mL (ref 46–106)
LV sys vol: 20 mL
Peak HR: 102 {beats}/min
Rest HR: 67 {beats}/min
SDS: 1
SRS: 1
SSS: 2
TID: 1.01

## 2019-04-27 MED ORDER — TECHNETIUM TC 99M TETROFOSMIN IV KIT
10.3000 | PACK | Freq: Once | INTRAVENOUS | Status: AC | PRN
  Administered 2019-04-27: 10.3 via INTRAVENOUS
  Filled 2019-04-27: qty 11

## 2019-04-27 MED ORDER — TECHNETIUM TC 99M TETROFOSMIN IV KIT
31.9000 | PACK | Freq: Once | INTRAVENOUS | Status: AC | PRN
  Administered 2019-04-27: 31.9 via INTRAVENOUS
  Filled 2019-04-27: qty 32

## 2019-04-27 MED ORDER — REGADENOSON 0.4 MG/5ML IV SOLN
0.4000 mg | Freq: Once | INTRAVENOUS | Status: AC
  Administered 2019-04-27: 0.4 mg via INTRAVENOUS

## 2019-05-06 MED FILL — ESTRADIOL 0.1 MG/GM CREA: 0.1 | 90 days supply | Qty: 43 | Fill #0

## 2019-05-06 MED FILL — OMEPRAZOLE 20 MG CPDR: 20 | 90 days supply | Qty: 90 | Fill #0

## 2019-05-06 MED FILL — LATUDA 60 MG TABLET: 60 | 30 days supply | Qty: 30 | Fill #1

## 2019-05-19 MED FILL — ADDERALL XR 20 MG CAP SA: 20 | 30 days supply | Qty: 30 | Fill #0

## 2019-05-20 MED FILL — DULOXETINE HCL 60 MG CPEP: 60 | 30 days supply | Qty: 30 | Fill #2

## 2019-05-20 MED FILL — LEVOTHYROXINE 100 MCG TAB: 100 | 90 days supply | Qty: 90 | Fill #0

## 2019-05-20 MED FILL — PROPRANOLOL 80 MG TABLET: 80 | 30 days supply | Qty: 90 | Fill #1

## 2019-05-21 ENCOUNTER — Other Ambulatory Visit: Payer: Self-pay | Admitting: Pharmacist

## 2019-05-21 MED ORDER — HUMIRA (2 PEN) 40 MG/0.4ML ~~LOC~~ AJKT: 40.0000 mg | AUTO-INJECTOR | SUBCUTANEOUS | 5 refills | Status: AC

## 2019-05-28 MED FILL — HUMIRA PEN 40 MG/0.4ML PNKT: 40 | 28 days supply | Qty: 2 | Fill #0

## 2019-06-04 MED FILL — clonazePAM 1 MG TABS: 1 | 30 days supply | Qty: 30 | Fill #1

## 2019-06-16 ENCOUNTER — Other Ambulatory Visit: Payer: 59

## 2019-06-16 MED FILL — LATUDA 60 MG TABLET: 60 | 30 days supply | Qty: 30 | Fill #2

## 2019-06-21 MED FILL — DULOXETINE HCL 60 MG CPEP: 60 | 30 days supply | Qty: 30 | Fill #3

## 2019-06-25 MED FILL — ADDERALL XR 20 MG CAP SA: 20 | 30 days supply | Qty: 30 | Fill #0

## 2019-09-02 ENCOUNTER — Other Ambulatory Visit: Payer: Self-pay | Admitting: Family Medicine

## 2019-09-02 DIAGNOSIS — Z1231 Encounter for screening mammogram for malignant neoplasm of breast: Secondary | ICD-10-CM

## 2019-10-20 ENCOUNTER — Ambulatory Visit
Admission: RE | Admit: 2019-10-20 | Discharge: 2019-10-20 | Disposition: A | Discharge status: Home / Self Care | Payer: 59 | Source: Ambulatory Visit | Attending: Family Medicine | Admitting: Family Medicine

## 2019-10-20 ENCOUNTER — Other Ambulatory Visit: Payer: Self-pay

## 2019-10-20 DIAGNOSIS — Z1231 Encounter for screening mammogram for malignant neoplasm of breast: Secondary | ICD-10-CM

## 2019-10-22 ENCOUNTER — Ambulatory Visit (INDEPENDENT_AMBULATORY_CARE_PROVIDER_SITE_OTHER): Payer: 59 | Admitting: Cardiology

## 2019-10-22 ENCOUNTER — Encounter: Payer: Self-pay | Admitting: Cardiology

## 2019-10-22 ENCOUNTER — Other Ambulatory Visit: Payer: Self-pay

## 2019-10-22 VITALS — BP 140/100 | HR 70 | Ht 66.0 in | Wt 198.0 lb

## 2019-10-22 DIAGNOSIS — E782 Mixed hyperlipidemia: Secondary | ICD-10-CM | POA: Diagnosis not present

## 2019-10-22 DIAGNOSIS — R079 Chest pain, unspecified: Secondary | ICD-10-CM

## 2019-10-22 DIAGNOSIS — I700 Atherosclerosis of aorta: Secondary | ICD-10-CM

## 2019-10-22 DIAGNOSIS — Z8249 Family history of ischemic heart disease and other diseases of the circulatory system: Secondary | ICD-10-CM

## 2019-10-22 DIAGNOSIS — Z79899 Other long term (current) drug therapy: Secondary | ICD-10-CM

## 2019-10-22 LAB — LIPID PANEL
Chol/HDL Ratio: 3.1 ratio (ref 0.0–4.4)
Cholesterol, Total: 184 mg/dL (ref 100–199)
HDL: 60 mg/dL (ref >39)
LDL Chol Calc (NIH): 98 mg/dL (ref 0–99)
Triglycerides: 150 mg/dL — ABNORMAL HIGH (ref 0–149)
VLDL Cholesterol Cal: 26 mg/dL (ref 5–40)

## 2019-10-22 LAB — ALT: ALT: 22 IU/L (ref 0–32)

## 2019-10-22 NOTE — Patient Instructions (Signed)
Medication Instructions:  The current medical regimen is effective;  continue present plan and medications.  *If you need a refill on your cardiac medications before your next appointment, please call your pharmacy*  Lab Work: Please have blood work today (Lipid/ ALT) If you have labs (blood work) drawn today and your tests are completely normal, you will receive your results only by: Marland Kitchen MyChart Message (if you have MyChart) OR . A paper copy in the mail If you have any lab test that is abnormal or we need to change your treatment, we will call you to review the results.  Follow-Up: Follow up as needed with Dr Marlou Porch.  Thank you for choosing [facility]!!

## 2019-10-22 NOTE — Progress Notes (Signed)
Cardiology Office Note:    Date:  10/22/2019   ID:  Kathryn Castaneda, DOB 14-Dec-1960, MRN [id number]  PCP:  Kathryn Rainier, MD  Cardiologist:  Kathryn Schultz, MD  Electrophysiologist:  None   Referring MD: Kathryn Rainier, MD     History of Present Illness:    Kathryn Castaneda is a 58 y.o. female with previously treated hepatitis C, migraine, anxiety with chest pain evaluation back on 04/13/2019 via telemedicine here for follow-up chest pain.  She was experiencing discomfort off and on with and without exertional activity in the center of her chest lasting about 10 minutes duration moderate in severity.  This was occurring once every 2 weeks.  Does not seem to be associated with food.  Her mother did have MI in her late 3s.  No tobacco use no diabetes.  Overall she is doing quite well.  She did have 1 other period of chest discomfort with stress but this was fleeting.  She is tolerating her atorvastatin well.  Denies any fevers chills nausea vomiting syncope bleeding orthopnea.  Past Medical History:  Diagnosis Date  . Acid reflux   . ADHD (attention deficit hyperactivity disorder)   . Anxiety   . Arthritis   . Depression   . Dilation of biliary tract 05/29/2013  . Enteritis 05/29/2013  . Headache   . Headache associated with sexual activity 02/03/2019  . Hepatitis C    had treatment 1990's  . Hyperlipemia   . Hypokalemia 05/29/2013  . Hypothyroidism   . Migraine 05/29/2013  . Thyroid disease     Past Surgical History:  Procedure Laterality Date  . ABDOMINAL HYSTERECTOMY    . CESAREAN SECTION    . JOINT REPLACEMENT Right   . REDUCTION MAMMAPLASTY    . ROTATOR CUFF REPAIR Right   . SHOULDER ARTHROSCOPY WITH BICEPSTENOTOMY Left 02/20/2016   Procedure: SHOULDER ARTHROSCOPY SUPSCAPULARIS REPAIR WITH BICEPS TENOTOMY OPEN TENODESIS;  Surgeon: Jones Broom, MD;  Location: [facility];  Service: Orthopedics;  Laterality: Left;    Current Medications: Current  Meds  Medication Sig  . Adalimumab (HUMIRA PEN) 40 MG/0.4ML PNKT Inject 40 mg into the skin every 14 (fourteen) days. 40 mg every 2 weeks subcutaneous 28 days  . amphetamine-dextroamphetamine (ADDERALL XR) 20 MG 24 hr capsule Take 20 mg by mouth daily.  Marland Kitchen aspirin EC 81 MG tablet Take 1 tablet (81 mg total) by mouth daily.  Marland Kitchen atorvastatin (LIPITOR) 40 MG tablet Take 1 tablet (40 mg total) by mouth daily.  . clonazePAM (KLONOPIN) 1 MG tablet Take 1 mg by mouth daily as needed for anxiety (sleep).   . DULoxetine (CYMBALTA) 60 MG capsule Take 60 mg by mouth daily.   Marland Kitchen eletriptan (RELPAX) 40 MG tablet Take 1 tablet (40 mg total) by mouth as needed for migraine or headache. May repeat in 2 hours if headache persists or recurs.  Marland Kitchen estradiol (ESTRACE) 0.1 MG/GM vaginal cream Place 1 Applicatorful vaginally 2 (two) times a week.  . levothyroxine (SYNTHROID, LEVOTHROID) 100 MCG tablet Take 100 mcg by mouth daily before breakfast.  . lurasidone (LATUDA) 80 MG TABS tablet Take 80 mg by mouth daily with breakfast.  . Multiple Vitamin (MULTIVITAMIN WITH MINERALS) TABS tablet Take 1 tablet by mouth daily.  . naproxen (NAPROSYN) 500 MG tablet Take 1 tablet (500 mg total) by mouth 2 (two) times daily with a meal.  . omeprazole (PRILOSEC) 20 MG capsule Take 20 mg by mouth daily.  . propranolol (INDERAL) 80 MG tablet  Take 1 tablet (80 mg total) by mouth 3 (three) times daily.     Allergies:   Ultram [tramadol hcl] and Lactose intolerance (gi)   Social History   Socioeconomic History  . Marital status: Married    Spouse name: Not on file  . Number of children: Not on file  . Years of education: Not on file  . Highest education level: Not on file  Occupational History  . Not on file  Social Needs  . Financial resource strain: Not on file  . Food insecurity    Worry: Not on file    Inability: Not on file  . Transportation needs    Medical: Not on file    Non-medical: Not on file  Tobacco Use  .  Smoking status: Never Smoker  . Smokeless tobacco: Never Used  Substance and Sexual Activity  . Alcohol use: Yes    Comment: occ  . Drug use: No  . Sexual activity: Not on file  Lifestyle  . Physical activity    Days per week: Not on file    Minutes per session: Not on file  . Stress: Not on file  Relationships  . Social Musician on phone: Not on file    Gets together: Not on file    Attends religious service: Not on file    Active member of club or organization: Not on file    Attends meetings of clubs or organizations: Not on file    Relationship status: Not on file  Other Topics Concern  . Not on file  Social History Narrative  . Not on file     Family History: The patient's family history is negative for Stroke and Headache.  ROS:   Please see the history of present illness.     All other systems reviewed and are negative.  EKGs/Labs/Other Studies Reviewed:    The following studies were reviewed today: Nuclear stress test 04/27/2019:   The left ventricular ejection fraction is hyperdynamic (>65%).  Nuclear stress EF: 66%.  There was no ST segment deviation noted during stress.  The study is normal.  This is a low risk study.   This is a normal Lexiscan nuclear stress test with no evidence for a prior infarct or ischemia. LVEF 66%.     EKG: 02/08/2019-sinus rhythm no other specific abnormalities.  Personally reviewed  Recent Labs: No results found for requested labs within last 8760 hours.  Recent Lipid Panel No results found for: CHOL, TRIG, HDL, CHOLHDL, VLDL, LDLCALC, LDLDIRECT  Physical Exam:    VS:  BP (!) 140/100   Pulse 70   Ht 5\' 6"  (1.676 m)   Wt 198 lb (89.8 kg)   SpO2 97%   BMI 31.96 kg/m     Wt Readings from Last 3 Encounters:  10/22/19 198 lb (89.8 kg)  04/27/19 180 lb (81.6 kg)  04/13/19 180 lb (81.6 kg)     GEN:  Well nourished, well developed in no acute distress HEENT: Normal NECK: No JVD; No carotid bruits  LYMPHATICS: No lymphadenopathy CARDIAC: RRR, no murmurs, rubs, gallops RESPIRATORY:  Clear to auscultation without rales, wheezing or rhonchi  ABDOMEN: Soft, non-tender, non-distended MUSCULOSKELETAL:  No edema; No deformity  SKIN: Warm and dry NEUROLOGIC:  Alert and oriented x 3 PSYCHIATRIC:  Normal affect   ASSESSMENT:    1. Chest pain, unspecified type   2. Aortic atherosclerosis (HCC)   3. Mixed hyperlipidemia   4. Family history of cardiovascular  disease   5. Long-term use of high-risk medication    PLAN:    In order of problems listed above:  Chest pain -Stress test was reassuring.  Low risk.  04/27/2019. -On propranolol 80 mg-migraine prophylaxis  Family history of CAD -Continue with atorvastatin 40 mg once a day. -Aspirin 81.  Hyperlipidemia -Continue with atorvastatin 40 mg.  Checking lipids and ALT.  Prior ALT is 7, prior LDL 111 HDL 56 triglycerides 179 total cholesterol 203 on 02/08/2019.  Aortic atherosclerosis -Noted on CT scan 05/29/2013 personally reviewed.  Continue with aspirin statin, atorvastatin 40 mg.  Elevated blood pressure/whitecoat -At home, blood pressures are normal.  Prior paramedic.  Continue to monitor.  Given her reassuring stress test and current medical therapy, prevention efforts, I feel comfortable seeing her back on an as-needed basis.  Dr. Drema Dallas may proceed with atorvastatin treatment.  However, please let us know if any further assistance is needed.  Medication Adjustments/Labs and Tests Ordered: Current medicines are reviewed at length with the patient today.  Concerns regarding medicines are outlined above.  Orders Placed This Encounter  Procedures  . ALT  . Lipid Profile   No orders of the defined types were placed in this encounter.   Patient Instructions  Medication Instructions:  The current medical regimen is effective;  continue present plan and medications.  *If you need a refill on your cardiac medications before  your next appointment, please call your pharmacy*  Lab Work: Please have blood work today (Lipid/ ALT) If you have labs (blood work) drawn today and your tests are completely normal, you will receive your results only by: Marland Kitchen MyChart Message (if you have MyChart) OR . A paper copy in the mail If you have any lab test that is abnormal or we need to change your treatment, we will call you to review the results.  Follow-Up: Follow up as needed with Dr Marlou Porch.  Thank you for choosing [facility]!!        Signed, Candee Furbish, MD  10/22/2019 9:12 AM    [place]

## 2019-10-25 ENCOUNTER — Encounter: Payer: Self-pay | Admitting: *Deleted

## 2020-04-12 ENCOUNTER — Other Ambulatory Visit: Payer: Self-pay | Admitting: Cardiology

## 2020-07-03 ENCOUNTER — Emergency Department: Payer: 59

## 2020-07-03 ENCOUNTER — Other Ambulatory Visit: Payer: Self-pay

## 2020-07-03 ENCOUNTER — Emergency Department
Admission: EM | Admit: 2020-07-03 | Discharge: 2020-07-03 | Disposition: A | Discharge status: Home / Self Care | Payer: 59 | Attending: Emergency Medicine | Admitting: Emergency Medicine

## 2020-07-03 DIAGNOSIS — Z23 Encounter for immunization: Secondary | ICD-10-CM | POA: Insufficient documentation

## 2020-07-03 DIAGNOSIS — E039 Hypothyroidism, unspecified: Secondary | ICD-10-CM | POA: Diagnosis not present

## 2020-07-03 DIAGNOSIS — L03116 Cellulitis of left lower limb: Secondary | ICD-10-CM | POA: Insufficient documentation

## 2020-07-03 DIAGNOSIS — T63421A Toxic effect of venom of ants, accidental (unintentional), initial encounter: Secondary | ICD-10-CM | POA: Insufficient documentation

## 2020-07-03 DIAGNOSIS — Y998 Other external cause status: Secondary | ICD-10-CM | POA: Insufficient documentation

## 2020-07-03 DIAGNOSIS — S4992XA Unspecified injury of left shoulder and upper arm, initial encounter: Secondary | ICD-10-CM | POA: Diagnosis present

## 2020-07-03 DIAGNOSIS — Y929 Unspecified place or not applicable: Secondary | ICD-10-CM | POA: Diagnosis not present

## 2020-07-03 DIAGNOSIS — S8012XA Contusion of left lower leg, initial encounter: Secondary | ICD-10-CM | POA: Insufficient documentation

## 2020-07-03 DIAGNOSIS — S42022A Displaced fracture of shaft of left clavicle, initial encounter for closed fracture: Secondary | ICD-10-CM | POA: Insufficient documentation

## 2020-07-03 DIAGNOSIS — Y9389 Activity, other specified: Secondary | ICD-10-CM | POA: Diagnosis not present

## 2020-07-03 DIAGNOSIS — Z7982 Long term (current) use of aspirin: Secondary | ICD-10-CM | POA: Insufficient documentation

## 2020-07-03 DIAGNOSIS — T07XXXA Unspecified multiple injuries, initial encounter: Secondary | ICD-10-CM

## 2020-07-03 DIAGNOSIS — S42002A Fracture of unspecified part of left clavicle, initial encounter for closed fracture: Secondary | ICD-10-CM

## 2020-07-03 DIAGNOSIS — L03119 Cellulitis of unspecified part of limb: Secondary | ICD-10-CM

## 2020-07-03 LAB — CBC WITH DIFFERENTIAL/PLATELET
Abs Immature Granulocytes: 0.01 10*3/uL (ref 0.00–0.07)
Basophils Absolute: 0 10*3/uL (ref 0.0–0.1)
Basophils Relative: 0 %
Eosinophils Absolute: 0.2 10*3/uL (ref 0.0–0.5)
Eosinophils Relative: 3 %
HCT: 40.9 % (ref 36.0–46.0)
Hemoglobin: 14.1 g/dL (ref 12.0–15.0)
Immature Granulocytes: 0 %
Lymphocytes Relative: 22 %
Lymphs Abs: 1.4 10*3/uL (ref 0.7–4.0)
MCH: 32.6 pg (ref 26.0–34.0)
MCHC: 34.5 g/dL (ref 30.0–36.0)
MCV: 94.5 fL (ref 80.0–100.0)
Monocytes Absolute: 0.5 10*3/uL (ref 0.1–1.0)
Monocytes Relative: 7 %
Neutro Abs: 4.3 10*3/uL (ref 1.7–7.7)
Neutrophils Relative %: 68 %
Platelets: 300 10*3/uL (ref 150–400)
RBC: 4.33 MIL/uL (ref 3.87–5.11)
RDW: 12.8 % (ref 11.5–15.5)
WBC: 6.4 10*3/uL (ref 4.0–10.5)
nRBC: 0 % (ref 0.0–0.2)

## 2020-07-03 LAB — COMPREHENSIVE METABOLIC PANEL
ALT: 12 U/L (ref 0–44)
AST: 23 U/L (ref 15–41)
Albumin: 3.8 g/dL (ref 3.5–5.0)
Alkaline Phosphatase: 72 U/L (ref 38–126)
Anion gap: 8 (ref 5–15)
BUN: 14 mg/dL (ref 6–20)
CO2: 29 mmol/L (ref 22–32)
Calcium: 9 mg/dL (ref 8.9–10.3)
Chloride: 103 mmol/L (ref 98–111)
Creatinine, Ser: 0.85 mg/dL (ref 0.44–1.00)
GFR calc Af Amer: >60 mL/min (ref >60)
GFR calc non Af Amer: >60 mL/min (ref >60)
Glucose, Bld: 112 mg/dL — ABNORMAL HIGH (ref 70–99)
Potassium: 4.7 mmol/L (ref 3.5–5.1)
Sodium: 140 mmol/L (ref 135–145)
Total Bilirubin: 0.8 mg/dL (ref 0.3–1.2)
Total Protein: 7.5 g/dL (ref 6.5–8.1)

## 2020-07-03 MED ORDER — METHYLPREDNISOLONE 4 MG PO TBPK
ORAL_TABLET | ORAL | 0 refills | Status: AC
Start: 2020-07-03 — End: ?

## 2020-07-03 MED ORDER — METHYLPREDNISOLONE SODIUM SUCC 125 MG IJ SOLR
125.0000 mg | Freq: Once | INTRAMUSCULAR | Status: AC
  Administered 2020-07-03: 125 mg via INTRAVENOUS
  Filled 2020-07-03: qty 2

## 2020-07-03 MED ORDER — ONDANSETRON HCL 4 MG/2ML IJ SOLN
4.0000 mg | Freq: Once | INTRAMUSCULAR | Status: AC
  Administered 2020-07-03: 4 mg via INTRAVENOUS
  Filled 2020-07-03: qty 2

## 2020-07-03 MED ORDER — SODIUM CHLORIDE 0.9 % IV SOLN
1.0000 g | Freq: Once | INTRAVENOUS | Status: AC
  Administered 2020-07-03: 1 g via INTRAVENOUS
  Filled 2020-07-03: qty 10

## 2020-07-03 MED ORDER — MORPHINE SULFATE (PF) 4 MG/ML IV SOLN
4.0000 mg | Freq: Once | INTRAVENOUS | Status: AC
  Administered 2020-07-03: 4 mg via INTRAVENOUS
  Filled 2020-07-03: qty 1

## 2020-07-03 MED ORDER — HYDROCODONE-ACETAMINOPHEN 5-325 MG PO TABS: 1.0000 | ORAL_TABLET | Freq: Four times a day (QID) | ORAL | 0 refills | Status: DC | PRN

## 2020-07-03 MED ORDER — CEPHALEXIN 500 MG PO CAPS: 500.0000 mg | ORAL_CAPSULE | Freq: Three times a day (TID) | ORAL | 0 refills | Status: AC

## 2020-07-03 MED ORDER — TETANUS-DIPHTH-ACELL PERTUSSIS 5-2.5-18.5 LF-MCG/0.5 IM SUSP
0.5000 mL | Freq: Once | INTRAMUSCULAR | Status: AC
  Administered 2020-07-03: 0.5 mL via INTRAMUSCULAR
  Filled 2020-07-03: qty 0.5
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
## 2020-07-03 NOTE — Discharge Instructions (Signed)
Follow-up with [facility] orthopedics concerning the clavicle fracture.  Apply ice to the left shoulder.  Keep the right foot elevated to aid in healing.  Do not pop the blisters.  Wash the area very gently with soap and water.  If the redness and swelling are worsening please return emergency department.  Take the antibiotic, steroids and pain medicine as prescribed.
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
## 2020-07-04 ENCOUNTER — Other Ambulatory Visit
Admission: RE | Admit: 2020-07-04 | Discharge: 2020-07-04 | Disposition: A | Discharge status: Home / Self Care | Payer: 59 | Source: Ambulatory Visit | Attending: Orthopedic Surgery | Admitting: Orthopedic Surgery

## 2020-07-04 ENCOUNTER — Other Ambulatory Visit: Payer: Self-pay | Admitting: Orthopedic Surgery

## 2020-07-04 DIAGNOSIS — Z20822 Contact with and (suspected) exposure to covid-19: Secondary | ICD-10-CM | POA: Insufficient documentation

## 2020-07-04 DIAGNOSIS — Z01812 Encounter for preprocedural laboratory examination: Secondary | ICD-10-CM | POA: Diagnosis present

## 2020-07-04 LAB — SARS CORONAVIRUS 2 (TAT 6-24 HRS): SARS Coronavirus 2: NEGATIVE

## 2020-07-05 ENCOUNTER — Other Ambulatory Visit: Payer: Self-pay

## 2020-07-05 ENCOUNTER — Encounter
Admission: RE | Admit: 2020-07-05 | Discharge: 2020-07-05 | Disposition: A | Discharge status: Home / Self Care | Payer: 59 | Source: Ambulatory Visit | Attending: Orthopedic Surgery | Admitting: Orthopedic Surgery

## 2020-07-05 NOTE — Patient Instructions (Signed)
COVID TESTING Date: July 04, 2020 Testing site:  [facility] - [address] Thru Hours:  4:09 am - 1:00 pm Once you are tested, you are asked to stay quarantined (avoiding public places) until after your surgery.   Your procedure is scheduled on: July 06, 2020 THURSDAY Report to Day Surgery on the 2nd floor of the [facility] To find out your arrival time, please call [phone] between 1PM - 3PM on: July 05, 2020  REMEMBER: Instructions that are not followed completely may result in serious medical risk, up to and including death; or upon the discretion of your surgeon and anesthesiologist your surgery may need to be rescheduled.  Do not eat food after midnight the night before surgery.  No gum chewing, lozengers or hard candies.  You may however, drink CLEAR liquids up to 2 hours before you are scheduled to arrive for your surgery. Do not drink anything within 2 hours of your scheduled arrival time.  Clear liquids include: - water  - apple juice without pulp - gatorade (not RED) - black coffee or tea (Do NOT add milk or creamers to the coffee or tea) Do NOT drink anything that is not on this list.  Type 1 and Type 2 diabetics should only drink water.  TAKE THESE MEDICATIONS THE MORNING OF SURGERY WITH A SIP OF WATER: PROPRANOLOL ATORVASTATIN KEFLEX PAIN PILL IF NEEDED LATUDA LEVOTHYROXINE MEDROL DOSE PAK OMEPRAZOLE (take one the night before and one on the morning of surgery - helps to prevent nausea after surgery.)  Stop Anti-inflammatories (NSAIDS) such as Advil, Aleve, Ibuprofen, Motrin, Naproxen, Naprosyn and ASPIRIN OR Aspirin based products such as Excedrin, Goodys Powder, BC Powder. (May take Tylenol or Acetaminophen if needed.)  Stop ANY OVER THE COUNTER supplements until after surgery. (May continue Vitamin D, Vitamin B, and multivitamin.)  No Alcohol for 24 hours before or after surgery.  No Smoking including e-cigarettes  for 24 hours prior to surgery.  No chewable tobacco products for at least 6 hours prior to surgery.  No nicotine patches on the day of surgery.  Do not use any "recreational" drugs for at least a week prior to your surgery.  Please be advised that the combination of cocaine and anesthesia may have negative outcomes, up to and including death. If you test positive for cocaine, your surgery will be cancelled.  On the morning of surgery brush your teeth with toothpaste and water, you may rinse your mouth with mouthwash if you wish. Do not swallow any toothpaste or mouthwash.  Do not wear jewelry, make-up, hairpins, clips or nail polish.  Do not wear lotions, powders, or perfumes OR DEODORANT   Do not shave 48 hours prior to surgery.   Contact lenses, hearing aids and dentures may not be worn into surgery.  Do not bring valuables to the hospital. [facility] is not responsible for any missing/lost belongings or valuables.   TAKE A SHOWER DAY OF SURGERY.  Notify your doctor if there is any change in your medical condition (cold, fever, infection).  Wear comfortable clothing (specific to your surgery type) to the hospital.  Plan for stool softeners for home use; pain medications have a tendency to cause constipation. You can also help prevent constipation by eating foods high in fiber such as fruits and vegetables and drinking plenty of fluids as your diet allows.  After surgery, you can help prevent lung complications by doing breathing exercises.  Take deep breaths and cough every  1-2 hours. Your doctor may order a device called an Incentive Spirometer to help you take deep breaths. When coughing or sneezing, hold a pillow firmly against your incision with both hands. This is called "splinting." Doing this helps protect your incision. It also decreases belly discomfort.  If you are being discharged the day of surgery, you will not be allowed to drive home. You will need a responsible  adult (59 years or older) to drive you home and stay with you that night.   If you are taking public transportation, you will need to have a responsible adult (59 years or older) with you. Please confirm with your physician that it is acceptable to use public transportation.   Please call the Pre-admissions Testing Dept. at [phone] if you have any questions about these instructions.  Visitation Policy:  Patients undergoing a surgery or procedure may have one family member or support person with them as long as that person is not COVID-19 positive or experiencing its symptoms.  That person may remain in the waiting area during the procedure.  Children under 34 years of age may have both parents or legal guardians with them during their procedure.  Inpatient Visitation Update:   Two designated support people may visit a patient during visiting hours 7 am to 8 pm. It must be the same two designated people for the duration of the patient stay. The visitors may come and go during the day, and there is no switching out to have different visitors. A mask must be worn at all times, including in the patient room.  Children under 32 years of age:  a total of 4 designated visitors for the child's entire stay are allowed. Only 2 in the room at a time and only one staying overnight at a time. The overnight guest can now rotate during the child's hospital stay.  As a reminder, masks are still required for all [place] team members, patients and visitors in all [facility] facilities.   Systemwide, no visitors 17 or younger.

## 2020-07-06 ENCOUNTER — Ambulatory Visit
Admission: RE | Admit: 2020-07-06 | Discharge: 2020-07-06 | Disposition: A | Discharge status: Home / Self Care | Payer: 59 | Attending: Orthopedic Surgery | Admitting: Orthopedic Surgery

## 2020-07-06 ENCOUNTER — Ambulatory Visit: Payer: 59

## 2020-07-06 ENCOUNTER — Other Ambulatory Visit: Payer: Self-pay

## 2020-07-06 ENCOUNTER — Ambulatory Visit: Payer: 59 | Admitting: Anesthesiology

## 2020-07-06 ENCOUNTER — Encounter: Payer: Self-pay | Admitting: Orthopedic Surgery

## 2020-07-06 ENCOUNTER — Encounter
Admission: RE | Disposition: A | Discharge status: Home / Self Care | Payer: Self-pay | Source: Home / Self Care | Attending: Orthopedic Surgery

## 2020-07-06 DIAGNOSIS — T148XXA Other injury of unspecified body region, initial encounter: Secondary | ICD-10-CM

## 2020-07-06 DIAGNOSIS — E785 Hyperlipidemia, unspecified: Secondary | ICD-10-CM | POA: Diagnosis not present

## 2020-07-06 DIAGNOSIS — S42032A Displaced fracture of lateral end of left clavicle, initial encounter for closed fracture: Secondary | ICD-10-CM | POA: Diagnosis not present

## 2020-07-06 DIAGNOSIS — K219 Gastro-esophageal reflux disease without esophagitis: Secondary | ICD-10-CM | POA: Diagnosis not present

## 2020-07-06 DIAGNOSIS — M069 Rheumatoid arthritis, unspecified: Secondary | ICD-10-CM | POA: Diagnosis not present

## 2020-07-06 DIAGNOSIS — F329 Major depressive disorder, single episode, unspecified: Secondary | ICD-10-CM | POA: Insufficient documentation

## 2020-07-06 DIAGNOSIS — G43909 Migraine, unspecified, not intractable, without status migrainosus: Secondary | ICD-10-CM | POA: Insufficient documentation

## 2020-07-06 DIAGNOSIS — Z79890 Hormone replacement therapy (postmenopausal): Secondary | ICD-10-CM | POA: Diagnosis not present

## 2020-07-06 DIAGNOSIS — F909 Attention-deficit hyperactivity disorder, unspecified type: Secondary | ICD-10-CM | POA: Insufficient documentation

## 2020-07-06 DIAGNOSIS — Z79899 Other long term (current) drug therapy: Secondary | ICD-10-CM | POA: Insufficient documentation

## 2020-07-06 DIAGNOSIS — Z7982 Long term (current) use of aspirin: Secondary | ICD-10-CM | POA: Diagnosis not present

## 2020-07-06 DIAGNOSIS — E039 Hypothyroidism, unspecified: Secondary | ICD-10-CM | POA: Diagnosis not present

## 2020-07-06 DIAGNOSIS — F419 Anxiety disorder, unspecified: Secondary | ICD-10-CM | POA: Diagnosis not present

## 2020-07-06 DIAGNOSIS — R0902 Hypoxemia: Secondary | ICD-10-CM

## 2020-07-06 HISTORY — PX: ORIF CLAVICULAR FRACTURE: SHX5055

## 2020-07-06 SURGERY — OPEN REDUCTION INTERNAL FIXATION (ORIF) CLAVICULAR FRACTURE
Anesthesia: General | Site: Shoulder | Laterality: Left

## 2020-07-06 MED ORDER — HYDROMORPHONE HCL 1 MG/ML IJ SOLN
INTRAMUSCULAR | Status: AC
  Filled 2020-07-06: qty 1

## 2020-07-06 MED ORDER — CEFAZOLIN SODIUM-DEXTROSE 2-4 GM/100ML-% IV SOLN
INTRAVENOUS | Status: AC
  Filled 2020-07-06: qty 100

## 2020-07-06 MED ORDER — MIDAZOLAM HCL 2 MG/2ML IJ SOLN
INTRAMUSCULAR | Status: DC | PRN
  Administered 2020-07-06: 2 mg via INTRAVENOUS

## 2020-07-06 MED ORDER — FENTANYL CITRATE (PF) 100 MCG/2ML IJ SOLN
INTRAMUSCULAR | Status: AC
  Administered 2020-07-06: 25 ug via INTRAVENOUS
  Filled 2020-07-06: qty 2

## 2020-07-06 MED ORDER — DEXAMETHASONE SODIUM PHOSPHATE 10 MG/ML IJ SOLN
INTRAMUSCULAR | Status: DC | PRN
  Administered 2020-07-06: 10 mg via INTRAVENOUS

## 2020-07-06 MED ORDER — ACETAMINOPHEN 500 MG PO TABS
1000.0000 mg | ORAL_TABLET | Freq: Three times a day (TID) | ORAL | 2 refills | Status: AC
Start: 2020-07-06 — End: 2021-07-06

## 2020-07-06 MED ORDER — GLYCOPYRROLATE 0.2 MG/ML IJ SOLN
INTRAMUSCULAR | Status: DC | PRN
  Administered 2020-07-06: .2 mg via INTRAVENOUS

## 2020-07-06 MED ORDER — NEOMYCIN-POLYMYXIN B GU 40-200000 IR SOLN
Status: DC | PRN
  Administered 2020-07-06: 4 mL

## 2020-07-06 MED ORDER — EPHEDRINE SULFATE 50 MG/ML IJ SOLN
INTRAMUSCULAR | Status: DC | PRN
  Administered 2020-07-06 (×2): 5 mg via INTRAVENOUS

## 2020-07-06 MED ORDER — BUPIVACAINE HCL 0.5 % IJ SOLN
INTRAMUSCULAR | Status: DC | PRN
Start: 2020-07-06 — End: 2020-07-06
  Administered 2020-07-06: 20 mL

## 2020-07-06 MED ORDER — LACTATED RINGERS IV SOLN: INTRAVENOUS | Status: DC

## 2020-07-06 MED ORDER — KETAMINE HCL 50 MG/ML IJ SOLN
INTRAMUSCULAR | Status: AC
  Filled 2020-07-06: qty 10

## 2020-07-06 MED ORDER — CHLORHEXIDINE GLUCONATE 0.12 % MT SOLN
OROMUCOSAL | Status: AC
  Administered 2020-07-06: 15 mL via OROMUCOSAL
  Filled 2020-07-06: qty 15

## 2020-07-06 MED ORDER — FENTANYL CITRATE (PF) 100 MCG/2ML IJ SOLN
INTRAMUSCULAR | Status: AC
  Filled 2020-07-06: qty 2

## 2020-07-06 MED ORDER — SUGAMMADEX SODIUM 500 MG/5ML IV SOLN
INTRAVENOUS | Status: DC | PRN
  Administered 2020-07-06: 200 mg via INTRAVENOUS

## 2020-07-06 MED ORDER — ONDANSETRON HCL 4 MG/2ML IJ SOLN
INTRAMUSCULAR | Status: DC | PRN
  Administered 2020-07-06: 4 mg via INTRAVENOUS

## 2020-07-06 MED ORDER — HYDROMORPHONE HCL 1 MG/ML IJ SOLN
INTRAMUSCULAR | Status: DC | PRN
  Administered 2020-07-06: 1 mg via INTRAVENOUS

## 2020-07-06 MED ORDER — ORAL CARE MOUTH RINSE: 15.0000 mL | Freq: Once | OROMUCOSAL | Status: AC

## 2020-07-06 MED ORDER — ACETAMINOPHEN 10 MG/ML IV SOLN
INTRAVENOUS | Status: AC
  Filled 2020-07-06: qty 100

## 2020-07-06 MED ORDER — ROCURONIUM BROMIDE 100 MG/10ML IV SOLN
INTRAVENOUS | Status: DC | PRN
  Administered 2020-07-06: 40 mg via INTRAVENOUS

## 2020-07-06 MED ORDER — PROMETHAZINE HCL 25 MG/ML IJ SOLN: 6.2500 mg | INTRAMUSCULAR | Status: DC | PRN

## 2020-07-06 MED ORDER — DEXMEDETOMIDINE HCL IN NACL 200 MCG/50ML IV SOLN
INTRAVENOUS | Status: DC | PRN
Start: 2020-07-06 — End: 2020-07-06
  Administered 2020-07-06: 12 ug via INTRAVENOUS

## 2020-07-06 MED ORDER — PROPOFOL 10 MG/ML IV BOLUS
INTRAVENOUS | Status: DC | PRN
  Administered 2020-07-06: 160 mg via INTRAVENOUS

## 2020-07-06 MED ORDER — LIDOCAINE HCL (CARDIAC) PF 100 MG/5ML IV SOSY
PREFILLED_SYRINGE | INTRAVENOUS | Status: DC | PRN
  Administered 2020-07-06: 60 mg via INTRAVENOUS

## 2020-07-06 MED ORDER — BUPIVACAINE LIPOSOME 1.3 % IJ SUSP
INTRAMUSCULAR | Status: DC | PRN
  Administered 2020-07-06: 20 mL

## 2020-07-06 MED ORDER — IPRATROPIUM-ALBUTEROL 0.5-2.5 (3) MG/3ML IN SOLN
3.0000 mL | Freq: Once | RESPIRATORY_TRACT | Status: AC
  Administered 2020-07-06: 3 mL via RESPIRATORY_TRACT

## 2020-07-06 MED ORDER — IPRATROPIUM-ALBUTEROL 0.5-2.5 (3) MG/3ML IN SOLN
RESPIRATORY_TRACT | Status: AC
  Filled 2020-07-06: qty 3

## 2020-07-06 MED ORDER — CEFAZOLIN SODIUM-DEXTROSE 2-4 GM/100ML-% IV SOLN
2.0000 g | INTRAVENOUS | Status: AC
  Administered 2020-07-06: 2 g via INTRAVENOUS

## 2020-07-06 MED ORDER — FENTANYL CITRATE (PF) 100 MCG/2ML IJ SOLN
25.0000 ug | INTRAMUSCULAR | Status: DC | PRN
  Administered 2020-07-06: 25 ug via INTRAVENOUS

## 2020-07-06 MED ORDER — SUGAMMADEX SODIUM 500 MG/5ML IV SOLN
INTRAVENOUS | Status: AC
  Filled 2020-07-06: qty 5

## 2020-07-06 MED ORDER — KETOROLAC TROMETHAMINE 30 MG/ML IJ SOLN
INTRAMUSCULAR | Status: AC
  Filled 2020-07-06: qty 1

## 2020-07-06 MED ORDER — FENTANYL CITRATE (PF) 100 MCG/2ML IJ SOLN
INTRAMUSCULAR | Status: DC | PRN
  Administered 2020-07-06: 100 ug via INTRAVENOUS
  Administered 2020-07-06 (×2): 50 ug via INTRAVENOUS

## 2020-07-06 MED ORDER — KETAMINE HCL 50 MG/ML IJ SOLN
INTRAMUSCULAR | Status: DC | PRN
Start: 2020-07-06 — End: 2020-07-06
  Administered 2020-07-06: 5 mg via INTRAMUSCULAR
  Administered 2020-07-06: 10 mg via INTRAMUSCULAR
  Administered 2020-07-06: 15 mg via INTRAMUSCULAR

## 2020-07-06 MED ORDER — BUPIVACAINE HCL (PF) 0.5 % IJ SOLN
INTRAMUSCULAR | Status: AC
  Filled 2020-07-06: qty 30

## 2020-07-06 MED ORDER — MIDAZOLAM HCL 2 MG/2ML IJ SOLN
INTRAMUSCULAR | Status: AC
  Filled 2020-07-06: qty 2

## 2020-07-06 MED ORDER — LIDOCAINE-EPINEPHRINE 1 %-1:100000 IJ SOLN
INTRAMUSCULAR | Status: AC
  Filled 2020-07-06: qty 1

## 2020-07-06 MED ORDER — ASPIRIN EC 325 MG PO TBEC
325.0000 mg | DELAYED_RELEASE_TABLET | Freq: Every day | ORAL | 0 refills | Status: AC
Start: 2020-07-06 — End: 2020-07-20

## 2020-07-06 MED ORDER — OXYCODONE HCL 5 MG PO TABS: 5.0000 mg | ORAL_TABLET | ORAL | 0 refills | Status: AC | PRN

## 2020-07-06 MED ORDER — CHLORHEXIDINE GLUCONATE 0.12 % MT SOLN: 15.0000 mL | Freq: Once | OROMUCOSAL | Status: AC

## 2020-07-06 MED ORDER — ACETAMINOPHEN 10 MG/ML IV SOLN
INTRAVENOUS | Status: DC | PRN
  Administered 2020-07-06: 1000 mg via INTRAVENOUS

## 2020-07-06 MED ORDER — SUCCINYLCHOLINE CHLORIDE 200 MG/10ML IV SOSY
PREFILLED_SYRINGE | INTRAVENOUS | Status: AC
  Filled 2020-07-06: qty 10

## 2020-07-06 MED ORDER — ONDANSETRON 4 MG PO TBDP
4.0000 mg | ORAL_TABLET | Freq: Three times a day (TID) | ORAL | 0 refills | Status: AC | PRN
Start: 2020-07-06 — End: ?

## 2020-07-06 MED ORDER — IPRATROPIUM-ALBUTEROL 0.5-2.5 (3) MG/3ML IN SOLN: 3.0000 mL | RESPIRATORY_TRACT | Status: DC

## 2020-07-06 SURGICAL SUPPLY — 58 items
ADH SKN CLS APL DERMABOND .7 (GAUZE/BANDAGES/DRESSINGS) ×1
APL PRP STRL LF DISP 70% ISPRP (MISCELLANEOUS) ×1
BIT DRILL 2.6 (BIT) ×3 IMPLANT
BNDG COHESIVE 4X5 TAN STRL (GAUZE/BANDAGES/DRESSINGS) ×3 IMPLANT
CANISTER SUCT 1200ML W/VALVE (MISCELLANEOUS) ×3 IMPLANT
CHLORAPREP W/TINT 26 (MISCELLANEOUS) ×3 IMPLANT
CLOSURE WOUND 1/2 X4 (GAUZE/BANDAGES/DRESSINGS) ×1
COVER WAND RF STERILE (DRAPES) ×3 IMPLANT
DERMABOND ADVANCED (GAUZE/BANDAGES/DRESSINGS) ×2
DERMABOND ADVANCED .7 DNX12 (GAUZE/BANDAGES/DRESSINGS) ×1 IMPLANT
DRAPE C-ARM XRAY 36X54 (DRAPES) ×3 IMPLANT
DRAPE IMP U-DRAPE 54X76 (DRAPES) ×6 IMPLANT
DRAPE INCISE IOBAN 66X45 STRL (DRAPES) ×6 IMPLANT
DRAPE SPLIT 6X30 W/TAPE (DRAPES) ×3 IMPLANT
DRAPE U-SHAPE 47X51 STRL (DRAPES) ×3 IMPLANT
DRSG OPSITE POSTOP 4X8 (GAUZE/BANDAGES/DRESSINGS) ×3 IMPLANT
GAUZE SPONGE 4X4 12PLY STRL (GAUZE/BANDAGES/DRESSINGS) ×3 IMPLANT
GAUZE XEROFORM 1X8 LF (GAUZE/BANDAGES/DRESSINGS) ×3 IMPLANT
GLOVE BIOGEL PI IND STRL 8 (GLOVE) ×1 IMPLANT
GLOVE BIOGEL PI INDICATOR 8 (GLOVE) ×2
GLOVE SURG ORTHO 8.0 STRL STRW (GLOVE) ×12 IMPLANT
GOWN STRL REUS W/ TWL LRG LVL3 (GOWN DISPOSABLE) ×1 IMPLANT
GOWN STRL REUS W/ TWL XL LVL3 (GOWN DISPOSABLE) ×1 IMPLANT
GOWN STRL REUS W/TWL LRG LVL3 (GOWN DISPOSABLE) ×3
GOWN STRL REUS W/TWL XL LVL3 (GOWN DISPOSABLE) ×3
IMMOBILIZER SHDR LG LX 900803 (SOFTGOODS) ×3 IMPLANT
K-WIRE 1.6X150 (WIRE) ×9
K-WIRE FX150X1.6XKRSH (WIRE) ×3
KIT STABILIZATION SHOULDER (MISCELLANEOUS) ×3 IMPLANT
KIT TURNOVER KIT A (KITS) ×3 IMPLANT
KWIRE FX150X1.6XKRSH (WIRE) ×3 IMPLANT
MASK FACE SPIDER DISP (MASK) ×3 IMPLANT
NDL FILTER BLUNT 18X1 1/2 (NEEDLE) ×1 IMPLANT
NEEDLE FILTER BLUNT 18X 1/2SAF (NEEDLE) ×2
NEEDLE FILTER BLUNT 18X1 1/2 (NEEDLE) ×1 IMPLANT
NS IRRIG 1000ML POUR BTL (IV SOLUTION) ×3 IMPLANT
PACK SHDR ARTHRO (MISCELLANEOUS) ×3 IMPLANT
PAD ARMBOARD 7.5X6 YLW CONV (MISCELLANEOUS) ×6 IMPLANT
PENCIL SMOKE EVACUATOR COATED (MISCELLANEOUS) ×3 IMPLANT
PLATE LATERAL SUPERIOR 3HOLE (Plate) ×3 IMPLANT
SCREW BONE 3.5X12 (Screw) ×6 IMPLANT
SCREW BONE 3.5X14MM (Screw) ×3 IMPLANT
SCREW BONE 3.5X16MM (Screw) ×3 IMPLANT
SCREW LOCKING 12X3.5MM (Screw) ×3 IMPLANT
SCREW LOCKING 14MM (Screw) ×6 IMPLANT
SCREW LOCKING 16MM (Screw) ×3 IMPLANT
SCREW LOCKING 3.5X22 (Screw) ×3 IMPLANT
STAPLER SKIN PROX 35W (STAPLE) ×3 IMPLANT
STRIP CLOSURE SKIN 1/2X4 (GAUZE/BANDAGES/DRESSINGS) ×2 IMPLANT
SUT FIBERWIRE #2 38 BLUE 1/2 (SUTURE) ×3
SUT MNCRL AB 4-0 PS2 18 (SUTURE) ×3 IMPLANT
SUT VIC AB 0 CT1 36 (SUTURE) ×3 IMPLANT
SUT VIC AB 2-0 CT2 27 (SUTURE) ×6 IMPLANT
SUT VIC AB 3-0 SH 27 (SUTURE) ×3
SUT VIC AB 3-0 SH 27X BRD (SUTURE) ×1 IMPLANT
SUTURE FIBERWR #2 38 BLUE 1/2 (SUTURE) ×1 IMPLANT
SYR 10ML LL (SYRINGE) ×3 IMPLANT
SYR 30ML LL (SYRINGE) ×3 IMPLANT

## 2020-07-06 NOTE — H&P (Signed)
Paper H&P to be scanned into permanent record. H&P reviewed. No significant changes noted.  
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
## 2020-07-06 NOTE — Transfer of Care (Signed)
Immediate Anesthesia Transfer of Care Note  Patient: Kathryn Castaneda  Procedure(s) Performed: OPEN REDUCTION INTERNAL FIXATION (ORIF) DISTAL CLAVICLE (Left Shoulder)  Patient Location: Endoscopy Unit  Anesthesia Type:General  Level of Consciousness: awake, drowsy and patient cooperative  Airway & Oxygen Therapy: Patient Spontanous Breathing and Patient connected to face mask oxygen  Post-op Assessment: Report given to RN and Post -op Vital signs reviewed and stable  Post vital signs: Reviewed and stable  Last Vitals:  Vitals Value Taken Time  BP 144/104 07/06/20 1533  Temp 36.1 C 07/06/20 1533  Pulse 82 07/06/20 1536  Resp 11 07/06/20 1536  SpO2 99 % 07/06/20 1536  Vitals shown include unvalidated device data.  Last Pain:  Vitals:   07/06/20 1012  TempSrc: Temporal  PainSc: 5          Complications: No complications documented.

## 2020-07-06 NOTE — Op Note (Signed)
Operative Note    SURGERY DATE: 07/06/2020   PRE-OP DIAGNOSIS:  1. Left distal clavicle fracture   POST-OP DIAGNOSIS:  1. Left distal clavicle fracture   PROCEDURES:  1. ORIF Left distal clavicle   SURGEON: Rosealee Albee, MD   ASSISTANT: Haywood Lasso, PA-S  ANESTHESIA: Gen   ESTIMATED BLOOD LOSS: 100cc   TOTAL IV FLUIDS: see anesthesia record  IMPLANTS: [employer]ker Variax 2 distal clavicle plate with 4 locking screws and 3 cortical screws   INDICATION(S):  Kathryn Castaneda is a 59 y.o. female who sustained an unstable distal clavicle fracture.  After discussion of risks, benefits, and alternatives to surgery, the patient elected to proceed with above procedure.   OPERATIVE FINDINGS: unstable, comminuted distal clavicle fracture   OPERATIVE REPORT:   I identified Kathryn Castaneda in the pre-operative holding area. Informed consent was obtained and the surgical site was marked. I reviewed the risks and benefits of the proposed surgical intervention and the patient (and/or patient's guardian) wished to proceed. The patient was transferred to the operative suite and general anesthesia was administered. The patient was placed in the beach chair position with the head of the bed elevated approximately 45 degrees. All down side pressure points were appropriately padded. Appropriate IV antibiotics were administered within 30 minutes before incision. The extremity was then prepped and draped in standard fashion. A time out was performed confirming the correct extremity, correct patient, and correct procedure.   A 10 cm incision was made along the inferior border of the clavicle. The incision was centered over the fracture site. Sharp dissection using electrocautery was performed from inferior to superior through the deltotrapezial fascia until the clavicular periosteum was encountered. The periosteum was sharply incised over a short segment of the medial and lateral fragments to expose the  fracture site.  The fracture site was cleaned of periosteum and a curette was used to debride any early callus and hematoma. The fracture site was copiously irrigated to remove all debris.   The medial fragment was grossly unstable.  The lateral fragment was split into 2 pieces in the transverse plane with a superior cortical shell piece and inferior primary piece that was attached to the [facility] joint and CC ligaments the fracture.  The [facility] joint was marked with a spinal needle.  The fracture was reduced by bringing the medial fragment anteriorly and inferiorly towards the lateral fragment.  An appropriately sized distal clavicle plate was selected.  It was placed just medial to the [facility] joint.  It was held in place with K wires.  This held the reduction well.  Reduction and plate position was confirmed with fluoroscopy.  The plate was then secured with 4 locking screws in the lateral fragment and 3 cortical screws in the medial fragment. There was an additional small fragment inferiorly that was loose.  A #2 FiberWire suture was passed around this fragment and tied to the plate, further stabilizing this fragment. Plate position, fracture reduction and screw length were found to be appropriate on fluoroscopy and direct visualization.    The wound was again copiously irrigated. The deltotrapezial fascia was closed with #0-Vicryl. Deep dermis was closed with buried 3-0 Vicryl and skin was closed with a running subcuticular 4-0 Monocryl. Dermabond and Honeycomb dressing was applied. A sling were placed. Patient was extubated, transferred to a stretcher bed and to the post anesthesia care unit in stable condition.   POSTOPERATIVE PLAN: The patient will be discharged home from PACU. Operative arm to remain  in sling at all times except elbow, wrist, finger RoM exercises and hygiene. Outpatient PT to start in 2 weeks. Patient to return to clinic in ~2 weeks for post-operative appointment.

## 2020-07-06 NOTE — Anesthesia Procedure Notes (Signed)
Procedure Name: Intubation Date/Time: 07/06/2020 12:20 PM Performed by: Mohammed Kindle, CRNA Pre-anesthesia Checklist: Patient identified, Emergency Drugs available, Suction available and Patient being monitored Patient Re-evaluated:Patient Re-evaluated prior to induction Oxygen Delivery Method: Circle system utilized Preoxygenation: Pre-oxygenation with 100% oxygen Induction Type: IV induction Ventilation: Mask ventilation without difficulty Laryngoscope Size: McGraph and 3 Tube type: Oral Number of attempts: 1 Airway Equipment and Method: Stylet and Oral airway Placement Confirmation: ETT inserted through vocal cords under direct vision,  positive ETCO2 and breath sounds checked- equal and bilateral Secured at: 22 cm Tube secured with: Tape Dental Injury: Teeth and Oropharynx as per pre-operative assessment

## 2020-07-06 NOTE — Discharge Instructions (Addendum)
AMBULATORY SURGERY  DISCHARGE INSTRUCTIONS   1) The drugs that you were given will stay in your system until tomorrow so for the next 24 hours you should not:  A) Drive an automobile B) Make any legal decisions C) Drink any alcoholic beverage   2) You may resume regular meals tomorrow.  Today it is better to start with liquids and gradually work up to solid foods.  You may eat anything you prefer, but it is better to start with liquids, then soup and crackers, and gradually work up to solid foods.   3) Please notify your doctor immediately if you have any unusual bleeding, trouble breathing, redness and pain at the surgery site, drainage, fever, or pain not relieved by medication. 4)   5) Your post-operative visit with Dr.                                     is: Date:                        Time:    Please call to schedule your post-operative visit.  6) Additional Instructions:    Rosealee Albee, MD  [facility]  Phone: [phone]  Fax: [phone]   Discharge Instructions after ORIF Clavicle    1. Activity/Sling: You are to be non-weight bearing on operative extremity. A sling/shoulder immobilizer has been provided for you. Only remove the sling to perform elbow, wrist, and hand RoM exercises and hygiene/dressing. Active reaching and lifting are not permitted. You will be given further instructions on sling use at your first physical therapy visit and postoperative visit with Dr. Allena Katz.   2. Dressings: Dressing may be removed after 5 days. Afterwards, you may either leave open to air (if no drainage) or cover with dry, sterile dressing. If you have steri-strips on your wound, please do not remove them. They will fall off on their own. You may shower 5 days after surgery. Please pat incision dry. Do not rub or place any shear forces across incision. If there is drainage or any opening of incision after 5 days, please notify our offices immediately.    3. Driving:   Plan on not driving for six weeks. Please note that you are advised NOT to drive while taking narcotic pain medications as you may be impaired and unsafe to drive.   4. Medications:  - You have been provided a prescription for narcotic pain medicine (usually oxycodone). After surgery, take 1-2 narcotic tablets every 4 hours if needed for severe pain. Please start this as soon as you begin to start having pain (if you received a nerve block, start taking as soon as this wears off or before bed).  - A prescription for anti-nausea medication will be provided in case the narcotic medicine causes nausea - take 1 tablet every 6 hours only if nauseated.  - Take enteric coated aspirin 325 mg once daily for 2 weeks to prevent blood clots. Do not take aspirin if you have an aspirin sensitivity/allergy or asthma or are on an anticoagulant (blood thinner) already. If so, then your home anticoagulant will be resume and managed - do not take aspirin. -Take tylenol 1000mg  (2 Extra strength or 3 regular strength tablets) every 8 hours for pain. This will reduce the amount of narcotic medication needed. May stop tylenol when you are having minimal pain. - Take a stool  softener (Colace, Dulcolax or Senakot) if you are using narcotic pain medications to help with constipation that is associated with narcotic use. - DO NOT take ANY nonsteroidal anti-inflammatory pain medications: Advil, Motrin, Ibuprofen, Aleve, Naproxen, or Naprosyn.   If you are taking prescription medication for anxiety, depression, insomnia, muscle spasm, chronic pain, or for attention deficit disorder you are advised that you are at a higher risk of adverse effects with use of narcotics post-op, including narcotic addiction/dependence, depressed breathing, death. If you use non-prescribed substances: alcohol, marijuana, cocaine, heroin, methamphetamines, etc., you are at a higher risk of adverse effects with use of narcotics post-op, including narcotic  addiction/dependence, depressed breathing, death. You are advised that taking > 50 morphine milligram equivalents (MME) of narcotic pain medication per day results in twice the risk of overdose or death. For your prescription provided: oxycodone 5 mg - taking more than 6 tablets per day after the first few days of surgery.   5. Physical Therapy: 1-2 times per week for ~12 weeks. Therapy typically starts ~2 weeks after surgery. You have been provided an order for physical therapy. The therapist will provide home exercises. Please contact our offices if this appointment has not been scheduled.    6. Work: May do light duty/desk job in approximately 1-2 weeks when off of narcotics, pain is well-controlled, and swelling has decreased if able to function with one arm in sling. Full work may take 6 weeks if light motions and function of both arms is required. Lifting jobs may require 12 weeks.   7. Post-Op Appointments: Your first post-op appointment will be with Dr. Allena Katz in approximately 2 weeks time.    If you find that they have not been scheduled please call the Orthopaedic Appointment front desk at [phone].

## 2020-07-07 ENCOUNTER — Encounter: Payer: Self-pay | Admitting: Orthopedic Surgery

## 2020-07-12 NOTE — Anesthesia Postprocedure Evaluation (Signed)
Anesthesia Post Note  Patient: Kathryn Castaneda  Procedure(s) Performed: OPEN REDUCTION INTERNAL FIXATION (ORIF) DISTAL CLAVICLE (Left Shoulder)  Patient location during evaluation: PACU Anesthesia Type: General Level of consciousness: awake and alert Pain management: pain level controlled Vital Signs Assessment: post-procedure vital signs reviewed and stable Respiratory status: spontaneous breathing, nonlabored ventilation, respiratory function stable and patient connected to nasal cannula oxygen Cardiovascular status: blood pressure returned to baseline and stable Postop Assessment: no apparent nausea or vomiting Anesthetic complications: no   No complications documented.   Last Vitals:  Vitals:   07/06/20 1833 07/06/20 1846  BP: (!) 129/90 (!) 129/91  Pulse: 73 71  Resp: (!) 11 18  Temp:  (!) 36.1 C  SpO2: 93% 95%    Last Pain:  Vitals:   07/07/20 0808  TempSrc:   PainSc: 0-No pain                 Yevette Edwards

## 2020-10-04 ENCOUNTER — Other Ambulatory Visit: Payer: Self-pay | Admitting: Cardiology

## 2020-12-26 ENCOUNTER — Other Ambulatory Visit: Payer: Self-pay | Admitting: Cardiology

## 2021-01-25 ENCOUNTER — Other Ambulatory Visit: Payer: Self-pay | Admitting: Family Medicine

## 2021-01-25 DIAGNOSIS — Z1231 Encounter for screening mammogram for malignant neoplasm of breast: Secondary | ICD-10-CM

## 2021-03-16 ENCOUNTER — Inpatient Hospital Stay: Admission: RE | Admit: 2021-03-16 | Payer: 59 | Source: Ambulatory Visit

## 2021-03-19 ENCOUNTER — Other Ambulatory Visit: Payer: Self-pay | Admitting: Cardiology

## 2021-04-12 ENCOUNTER — Other Ambulatory Visit: Payer: Self-pay | Admitting: Cardiology

## 2021-04-23 ENCOUNTER — Other Ambulatory Visit (HOSPITAL_COMMUNITY): Payer: Self-pay

## 2021-04-23 MED ORDER — CLOTRIMAZOLE-BETAMETHASONE 1-0.05 % EX CREA
TOPICAL_CREAM | CUTANEOUS | 1 refills | Status: AC
  Filled 2021-04-23: qty 15, 14d supply, fill #0

## 2021-04-24 ENCOUNTER — Other Ambulatory Visit (HOSPITAL_COMMUNITY): Payer: Self-pay

## 2021-04-30 ENCOUNTER — Other Ambulatory Visit (HOSPITAL_COMMUNITY): Payer: Self-pay

## 2021-04-30 MED ORDER — NEOMYCIN-POLYMYXIN-HC 3.5-10000-1 OT SUSP
OTIC | 0 refills | Status: AC
  Filled 2021-04-30: qty 10, 22d supply, fill #0

## 2021-05-04 ENCOUNTER — Other Ambulatory Visit (HOSPITAL_COMMUNITY): Payer: Self-pay
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
## 2021-07-27 IMAGING — RF DG C-ARM 1-60 MIN
1 series · 4 of 4 positions shown · non-contrast
Comparison: None.

CLINICAL DATA: Fracture.

EXAM:
DG C-ARM 1-60 MIN
FLUOROSCOPY TIME:  Fluoroscopy Time:  54 seconds
Radiation Exposure Index (if provided by the fluoroscopic device):
Not provided
Number of Acquired Spot Images: 4

[Series 1: dg or · 0.14mm/px · 4 of 4 slices shown]
[im 1/4]
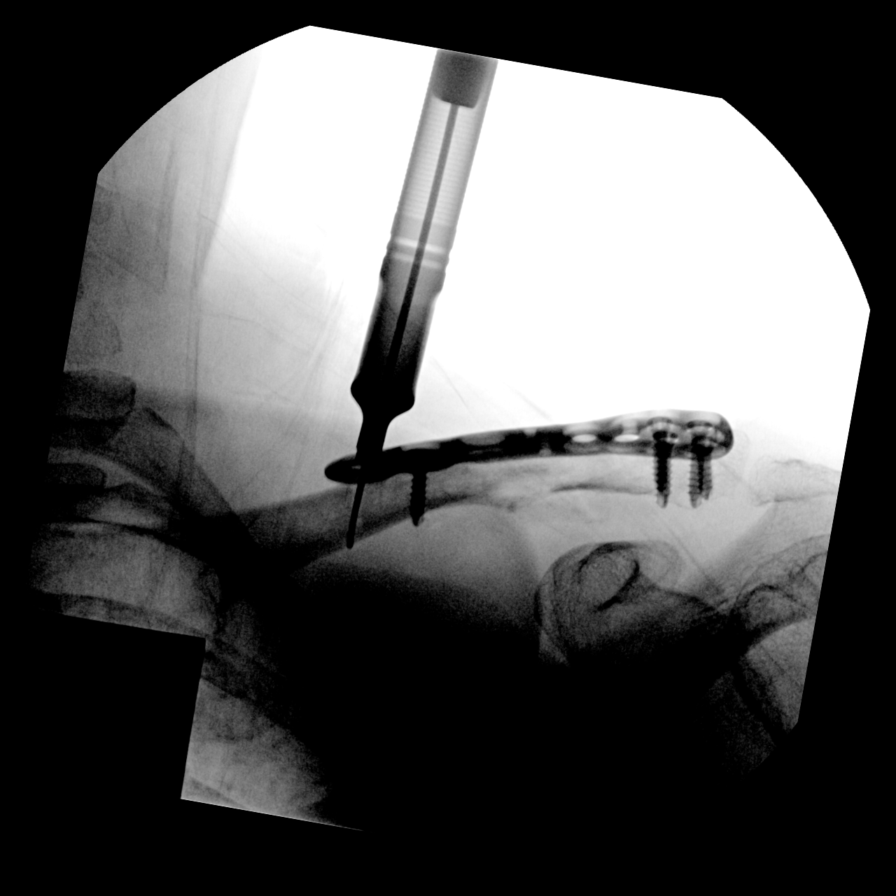
[im 2/4]
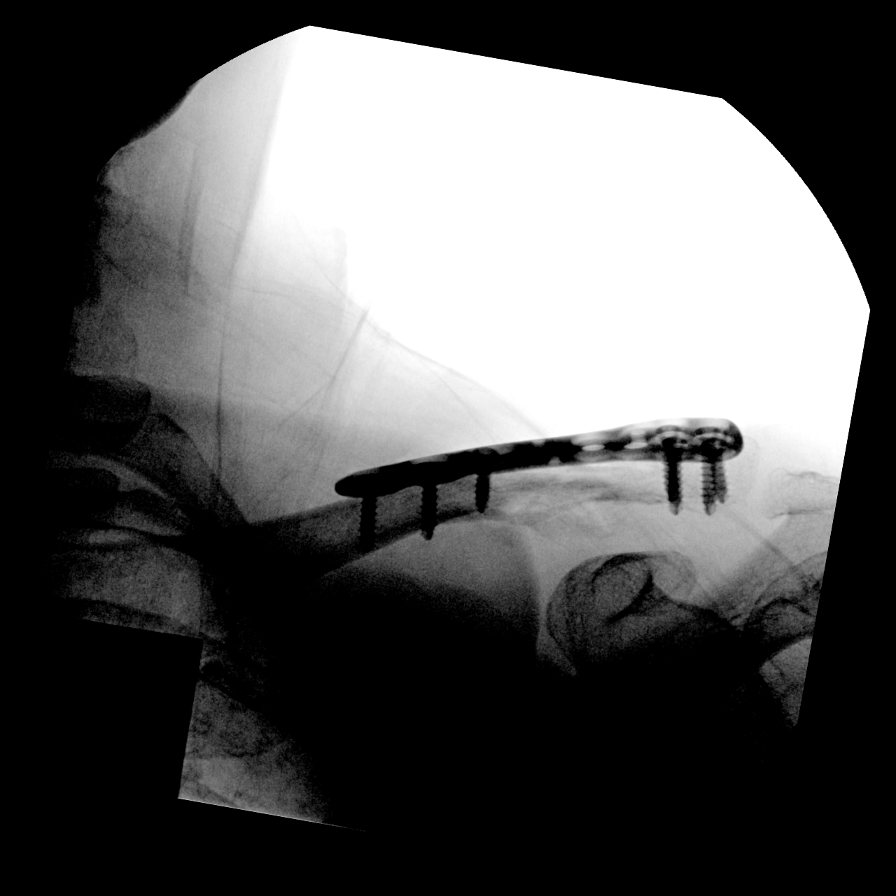
[im 3/4]
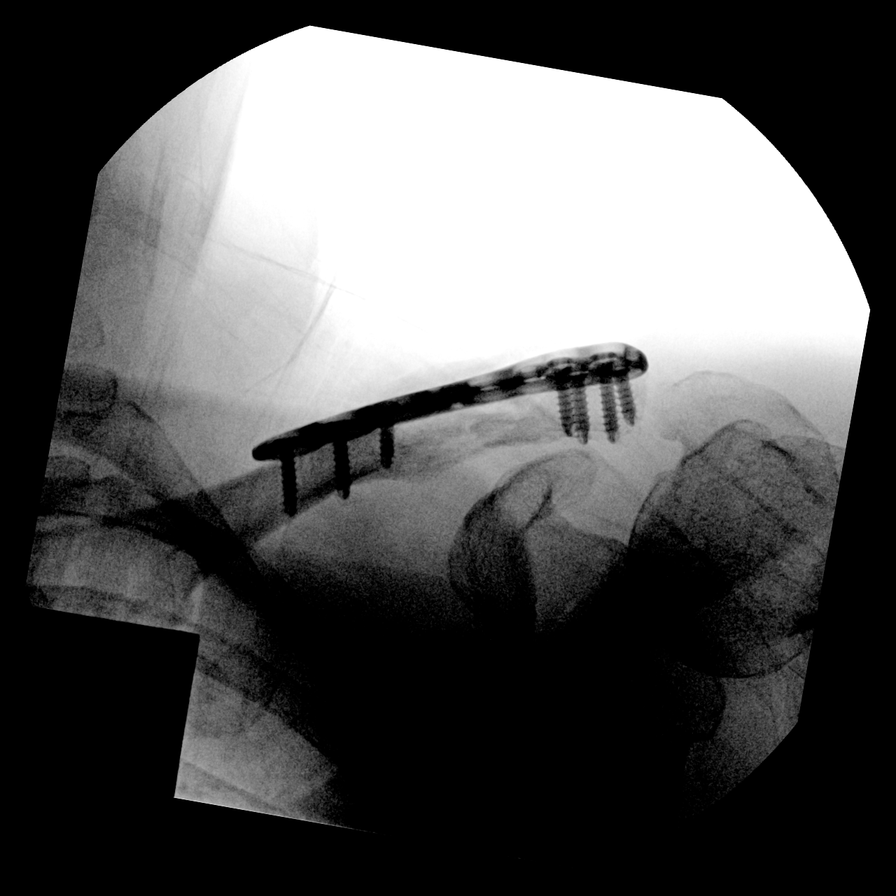
[im 4/4]
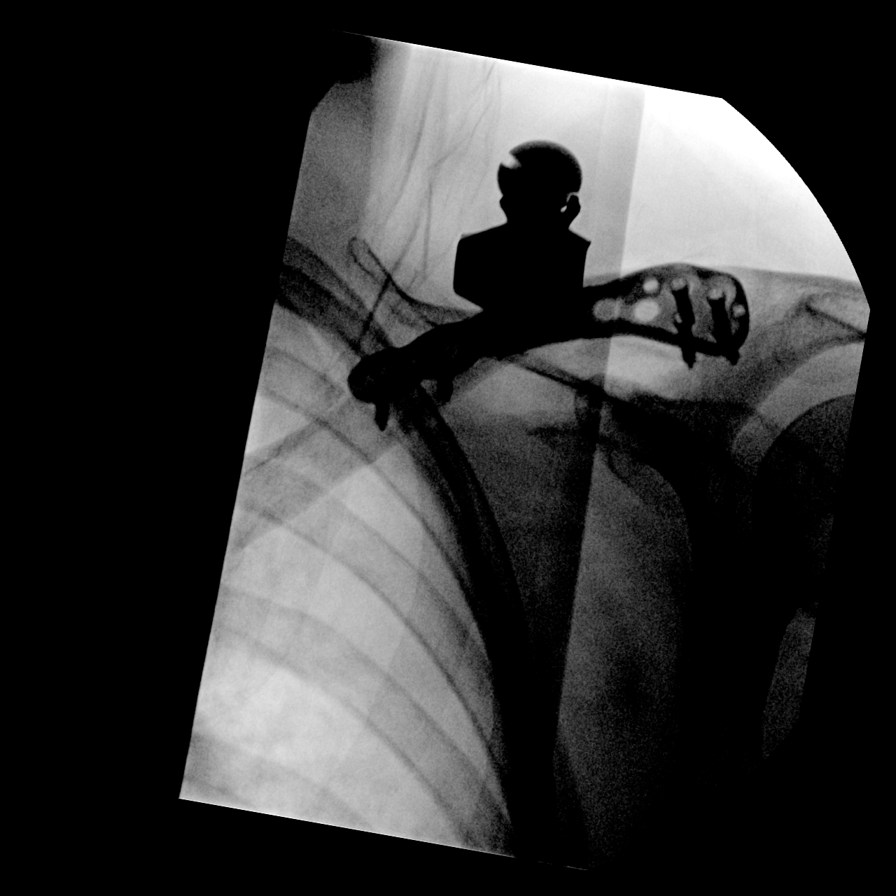

[4 of 4 positions shown; findings below may reference images not displayed]

FINDINGS: Four fluoroscopic spot images obtained in the operating room of the
left clavicle demonstrate plate and screw fixation distal clavicle
fracture.
IMPRESSION: Fluoroscopic spot views during left clavicle fracture fixation.

## 2021-07-27 IMAGING — RF DG CLAVICLE*L*
1 series · 4 of 4 positions shown · non-contrast
Comparison: None.

CLINICAL DATA: Fracture.

EXAM:
DG C-ARM 1-60 MIN
FLUOROSCOPY TIME:  Fluoroscopy Time:  54 seconds
Radiation Exposure Index (if provided by the fluoroscopic device):
Not provided
Number of Acquired Spot Images: 4

[Series 1: dg or · 0.14mm/px · 4 of 4 slices shown]
[im 1/4]
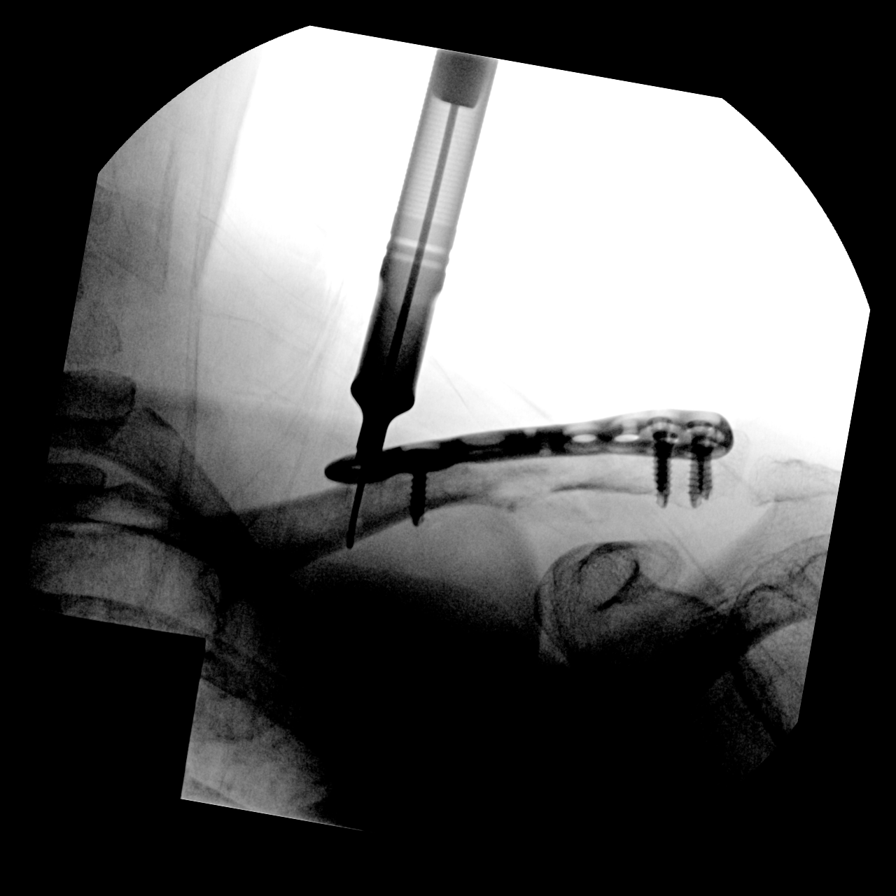
[im 2/4]
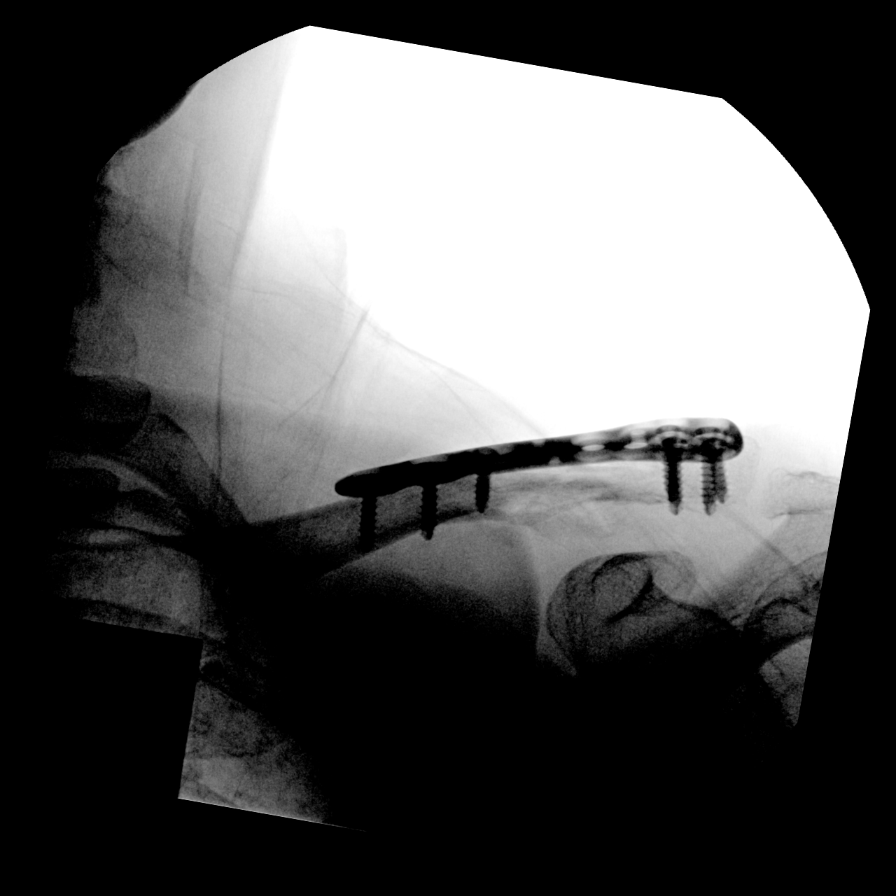
[im 3/4]
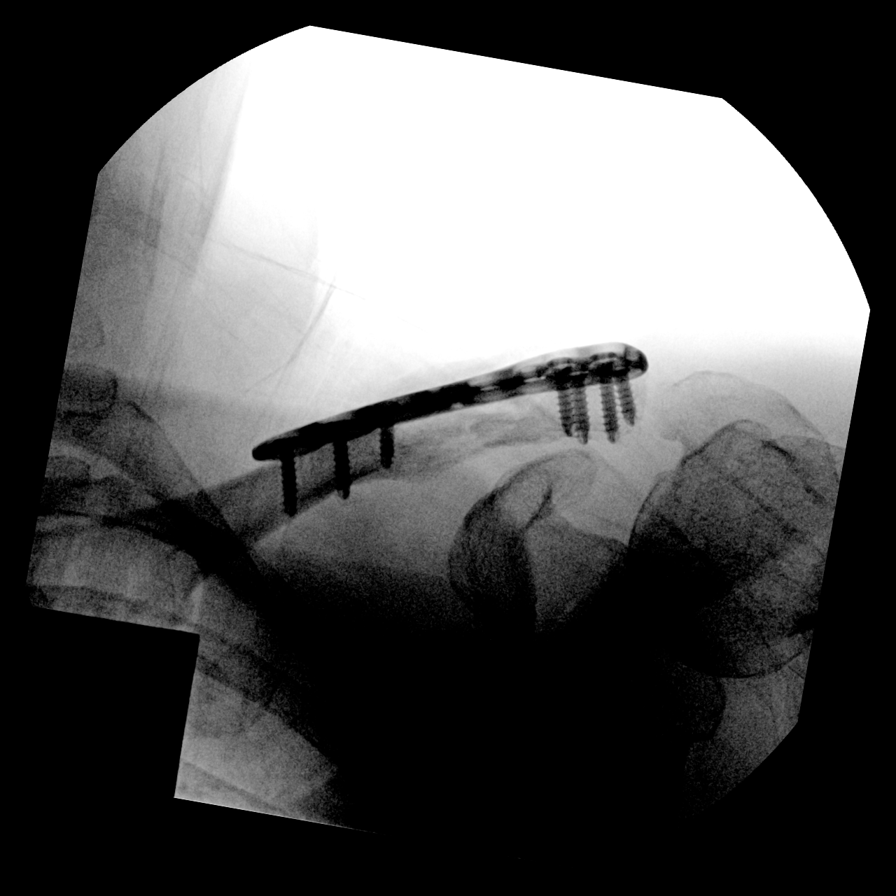
[im 4/4]
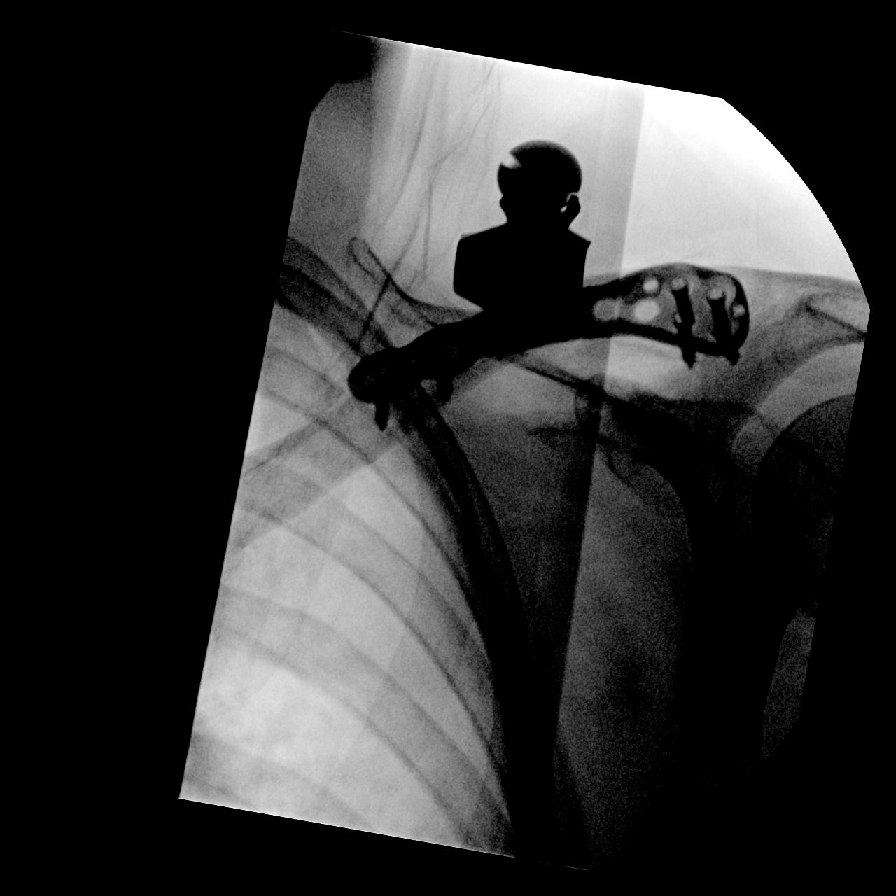

[4 of 4 positions shown; findings below may reference images not displayed]

FINDINGS: Four fluoroscopic spot images obtained in the operating room of the
left clavicle demonstrate plate and screw fixation distal clavicle
fracture.
IMPRESSION: Fluoroscopic spot views during left clavicle fracture fixation.

## 2021-07-27 IMAGING — DX DG CHEST 1V PORT
1 series · 1 of 1 positions shown · non-contrast
Comparison: May 29, 2013.

CLINICAL DATA: Hypoxia.

EXAM:
PORTABLE CHEST 1 VIEW

[chest ap]
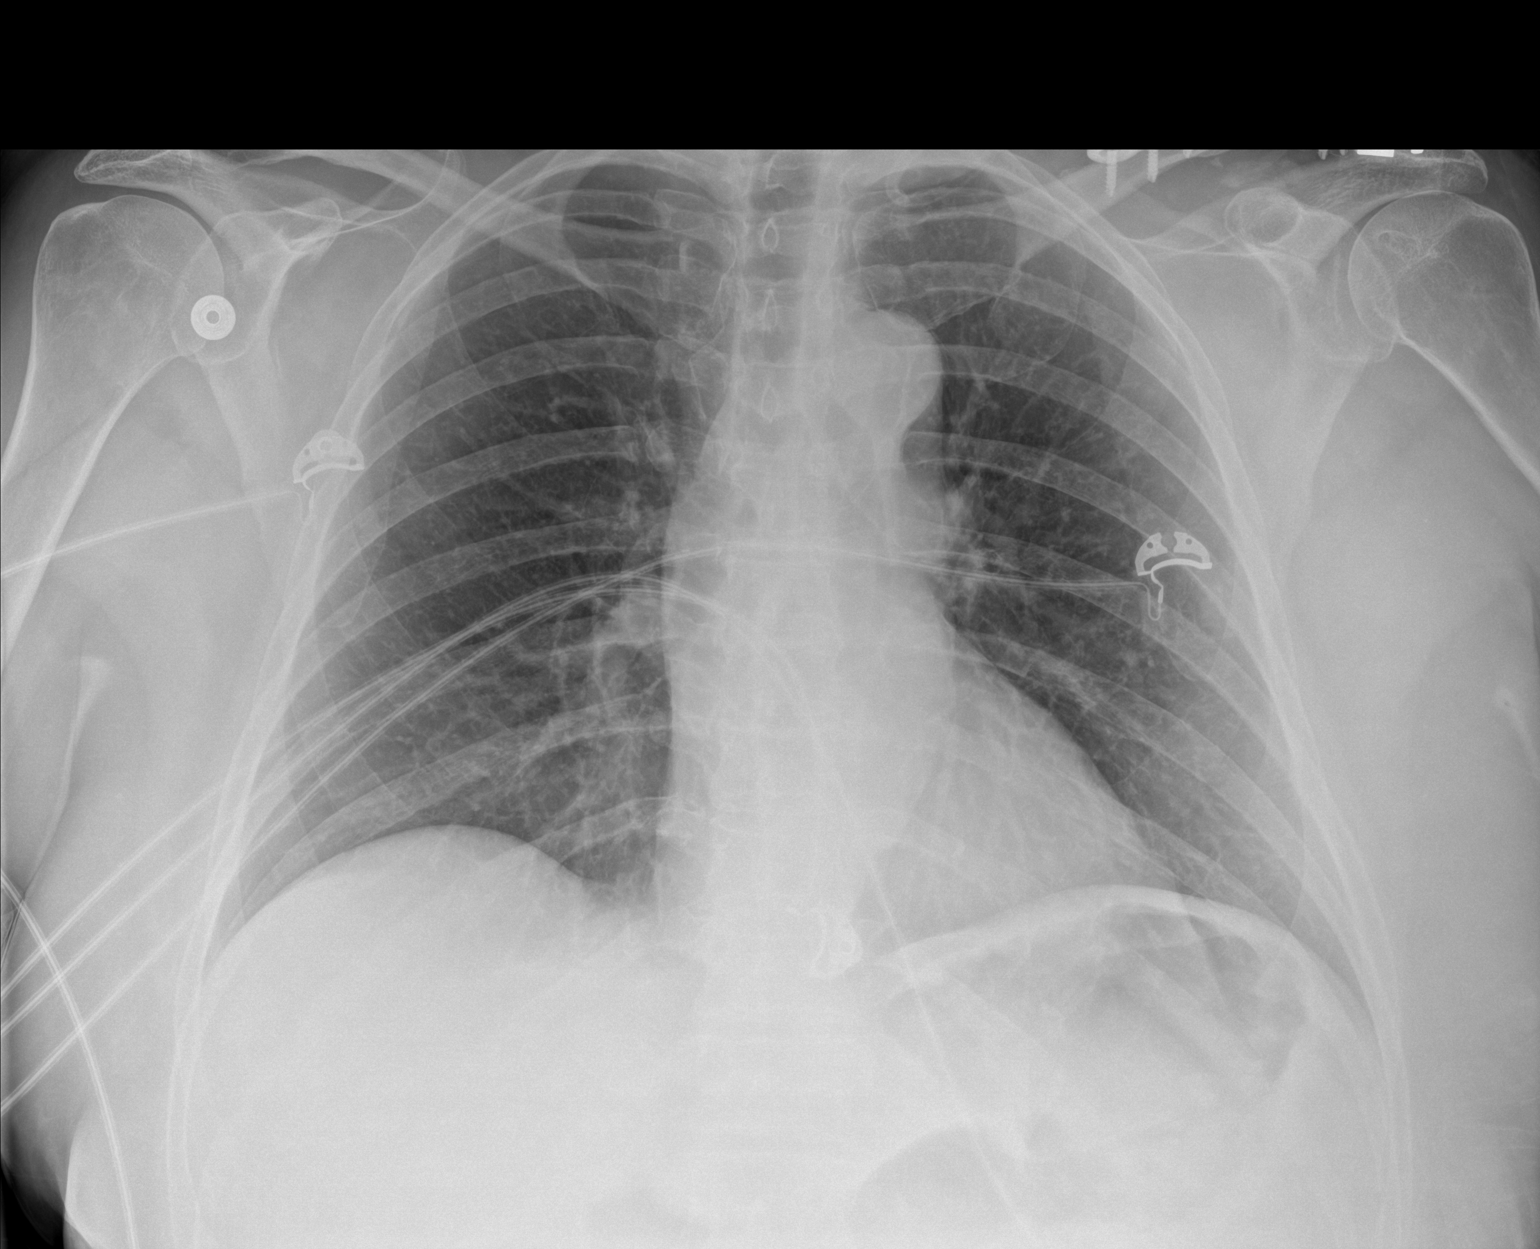

[1 of 1 positions shown; findings below may reference images not displayed]

FINDINGS: The heart size and mediastinal contours are within normal limits.
Both lungs are clear. No pneumothorax or pleural effusion is noted.
The visualized skeletal structures are unremarkable.
IMPRESSION: No active disease.

## 2021-08-09 DEATH — deceased

## 2021-09-20 ENCOUNTER — Other Ambulatory Visit (HOSPITAL_COMMUNITY): Payer: Self-pay
